# Patient Record
Sex: Female | Born: 1950 | Race: White | Hispanic: No | Marital: Married | Smoking: Never smoker
Health system: Southern US, Community
[De-identification: ages and names within clinical notes are randomized; demographics above are authoritative.]

## PROBLEM LIST (undated history)

## (undated) DIAGNOSIS — I10 Essential (primary) hypertension: Secondary | ICD-10-CM

## (undated) DIAGNOSIS — E7800 Pure hypercholesterolemia, unspecified: Secondary | ICD-10-CM

## (undated) DIAGNOSIS — M810 Age-related osteoporosis without current pathological fracture: Secondary | ICD-10-CM

## (undated) HISTORY — DX: Essential (primary) hypertension: I10

## (undated) HISTORY — DX: Pure hypercholesterolemia, unspecified: E78.00

## (undated) HISTORY — DX: Age-related osteoporosis without current pathological fracture: M81.0

## (undated) HISTORY — PX: CHOLECYSTECTOMY: SHX55

---

## 2018-03-14 DIAGNOSIS — I10 Essential (primary) hypertension: Secondary | ICD-10-CM | POA: Insufficient documentation

## 2018-03-14 DIAGNOSIS — R935 Abnormal findings on diagnostic imaging of other abdominal regions, including retroperitoneum: Secondary | ICD-10-CM | POA: Insufficient documentation

## 2018-03-21 DIAGNOSIS — E782 Mixed hyperlipidemia: Secondary | ICD-10-CM | POA: Insufficient documentation

## 2018-03-21 DIAGNOSIS — Z8673 Personal history of transient ischemic attack (TIA), and cerebral infarction without residual deficits: Secondary | ICD-10-CM | POA: Insufficient documentation

## 2018-04-19 DIAGNOSIS — I639 Cerebral infarction, unspecified: Secondary | ICD-10-CM | POA: Insufficient documentation

## 2018-04-19 DIAGNOSIS — E785 Hyperlipidemia, unspecified: Secondary | ICD-10-CM | POA: Insufficient documentation

## 2019-04-11 DIAGNOSIS — E119 Type 2 diabetes mellitus without complications: Secondary | ICD-10-CM | POA: Insufficient documentation

## 2019-05-29 DIAGNOSIS — R413 Other amnesia: Secondary | ICD-10-CM | POA: Insufficient documentation

## 2019-05-29 DIAGNOSIS — E119 Type 2 diabetes mellitus without complications: Secondary | ICD-10-CM | POA: Insufficient documentation

## 2019-07-06 DIAGNOSIS — M8580 Other specified disorders of bone density and structure, unspecified site: Secondary | ICD-10-CM | POA: Insufficient documentation

## 2019-07-06 DIAGNOSIS — K449 Diaphragmatic hernia without obstruction or gangrene: Secondary | ICD-10-CM | POA: Insufficient documentation

## 2019-07-06 DIAGNOSIS — K2970 Gastritis, unspecified, without bleeding: Secondary | ICD-10-CM | POA: Insufficient documentation

## 2019-07-06 DIAGNOSIS — F0150 Vascular dementia without behavioral disturbance: Secondary | ICD-10-CM | POA: Insufficient documentation

## 2019-07-06 DIAGNOSIS — K2980 Duodenitis without bleeding: Secondary | ICD-10-CM | POA: Insufficient documentation

## 2019-12-10 ENCOUNTER — Ambulatory Visit: Payer: Medicare PPO | Attending: Internal Medicine

## 2019-12-10 DIAGNOSIS — Z23 Encounter for immunization: Secondary | ICD-10-CM

## 2019-12-10 NOTE — Progress Notes (Signed)
   Covid-19 Vaccination Clinic  Name:  Danielle George    MRN: [id number] DOB: 23-Apr-1951  12/10/2019  [name] was observed post Covid-19 immunization for 15 minutes without incidence. She was provided with Vaccine Information Sheet and instruction to access the V-Safe system.   [name] was instructed to call 911 with any severe reactions post vaccine: Marland Kitchen Difficulty breathing  . Swelling of your face and throat  . A fast heartbeat  . A bad rash all over your body  . Dizziness and weakness    Immunizations Administered    Name Date Dose VIS Date Route   Pfizer COVID-19 Vaccine 12/10/2019  5:18 PM 0.3 mL 10/13/2019 Intramuscular   Manufacturer: [employer], [employer]   Lot: [id number]   NDC: [address]-2

## 2019-12-31 ENCOUNTER — Ambulatory Visit: Payer: Self-pay

## 2020-01-04 ENCOUNTER — Ambulatory Visit: Payer: Medicare PPO | Attending: Internal Medicine

## 2020-01-04 DIAGNOSIS — Z23 Encounter for immunization: Secondary | ICD-10-CM | POA: Insufficient documentation

## 2020-01-04 NOTE — Progress Notes (Signed)
   Covid-19 Vaccination Clinic  Name:  Danielle George    MRN: [id number] DOB: 1951/04/06  01/04/2020  [name] was observed post Covid-19 immunization for 15 minutes without incident. She was provided with Vaccine Information Sheet and instruction to access the V-Safe system.   [name] was instructed to call 911 with any severe reactions post vaccine: Marland Kitchen Difficulty breathing  . Swelling of face and throat  . A fast heartbeat  . A bad rash all over body  . Dizziness and weakness   Immunizations Administered    Name Date Dose VIS Date Route   Pfizer COVID-19 Vaccine 01/04/2020  2:11 PM 0.3 mL 10/13/2019 Intramuscular   Manufacturer: [employer], [employer]   Lot: [id number]   NDC: [address]-8

## 2022-01-01 ENCOUNTER — Encounter: Payer: Self-pay | Admitting: Family

## 2022-01-01 ENCOUNTER — Ambulatory Visit: Payer: Medicare PPO | Admitting: Family

## 2022-01-01 ENCOUNTER — Ambulatory Visit (INDEPENDENT_AMBULATORY_CARE_PROVIDER_SITE_OTHER)
Admission: RE | Admit: 2022-01-01 | Discharge: 2022-01-01 | Disposition: A | Discharge status: Home / Self Care | Payer: Medicare PPO | Source: Ambulatory Visit | Attending: Family | Admitting: Family

## 2022-01-01 ENCOUNTER — Other Ambulatory Visit: Payer: Self-pay

## 2022-01-01 VITALS — BP 124/82 | HR 109 | Ht 64.0 in | Wt 136.0 lb

## 2022-01-01 DIAGNOSIS — E782 Mixed hyperlipidemia: Secondary | ICD-10-CM | POA: Diagnosis not present

## 2022-01-01 DIAGNOSIS — I10 Essential (primary) hypertension: Secondary | ICD-10-CM | POA: Diagnosis not present

## 2022-01-01 DIAGNOSIS — M25551 Pain in right hip: Secondary | ICD-10-CM | POA: Diagnosis not present

## 2022-01-01 DIAGNOSIS — Z780 Asymptomatic menopausal state: Secondary | ICD-10-CM

## 2022-01-01 DIAGNOSIS — R739 Hyperglycemia, unspecified: Secondary | ICD-10-CM | POA: Insufficient documentation

## 2022-01-01 DIAGNOSIS — I69319 Unspecified symptoms and signs involving cognitive functions following cerebral infarction: Secondary | ICD-10-CM | POA: Diagnosis not present

## 2022-01-01 NOTE — Progress Notes (Signed)
? ?New Patient Office Visit ? ?Subjective:  ?Patient ID: Danielle George, female    DOB: 24-Oct-1951  Age: 70 y.o. MRN: [id number] ? ?CC:  ?Chief Complaint  ?Patient presents with  ? Abdominal Pain  ?  Pt stated had injury 7 years ago, right side hip is sore for about --1 month  ? ? ?HPI ?Danielle George is here to establish care as a new patient. ? ?Has provider in [place], [place].  ?Pt is without acute concerns.  ? ?Right hip pain, feels some soreness/puffiness on the right anterior hip. Sore with movement, did hike a lot in the last few weeks in [place], and raking leaves as well which probably hasn't helped.  ?Aspercreme with mild relief. Bone density in the past, no known ostoeporosis or osteopenia.  ? ?chronic concerns: ? ?GERD: with suspected ventral hernia, controlled with prilosec 40 mg  ? ?CVA: incidentally found on MRI? By opthalmologic. With memory deficits from this. Sees opthamologist, biannually who is working on prisms that are helpful to the eye to retrain for memory, referred from neurologist. Ongoing memory concerns.  ? ?HLD: last labwork about six months ago tolerating crestor well.  ? ?History reviewed. No pertinent past medical history. ? ?Past Surgical History:  ?Procedure Laterality Date  ? CHOLECYSTECTOMY    ? ? ?No family history on file. ? ?Social History  ? ?Socioeconomic History  ? Marital status: Married  ?  Spouse name: Not on file  ? Number of children: Not on file  ? Years of education: Not on file  ? Highest education level: Not on file  ?Occupational History  ? Not on file  ?Tobacco Use  ? Smoking status: Never  ? Smokeless tobacco: Never  ?Vaping Use  ? Vaping Use: Never used  ?Substance and Sexual Activity  ? Alcohol use: Not Currently  ? Drug use: Never  ? Sexual activity: Yes  ?  Partners: Male  ?Other Topics Concern  ? Not on file  ?Social History Narrative  ? Not on file  ? ?Social Determinants of Health  ? ?Financial Resource Strain: Not on file  ?Food  Insecurity: Not on file  ?Transportation Needs: Not on file  ?Physical Activity: Not on file  ?Stress: Not on file  ?Social Connections: Not on file  ?Intimate Partner Violence: Not on file  ? ? ?Outpatient Medications Prior to Visit  ?Medication Sig Dispense Refill  ? aspirin EC 81 MG tablet Take 81 mg by mouth daily. Swallow whole.    ? metoprolol succinate (TOPROL-XL) 25 MG 24 hr tablet Take 25 mg by mouth daily.    ? omeprazole (PRILOSEC) 40 MG capsule Take 40 mg by mouth daily.    ? rosuvastatin (CRESTOR) 10 MG tablet Take by mouth.    ? ?No facility-administered medications prior to visit.  ? ? ?Allergies  ?Allergen Reactions  ? Penicillins   ? ? ?ROS ?Review of Systems  ?Constitutional:  Negative for chills and fatigue.  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 Negative for photophobia.  ?Respiratory:  Negative for cough and shortness of breath.   ?Cardiovascular:  Negative for chest pain and leg swelling.  ?Gastrointestinal:  Negative for diarrhea and nausea.  ?Genitourinary:  Negative for difficulty urinating.  ?Musculoskeletal:  Positive for arthralgias (right hip /groin pain worse with movement).  ?Psychiatric/Behavioral:  Positive for confusion (at times with memory deficits since stroke). Negative for agitation, decreased concentration,  sleep disturbance and suicidal ideas. The patient is not nervous/anxious.   ?All other systems reviewed and are negative. ? ? ?  ?Objective:  ?  ?Physical Exam ?Cardiovascular:  ?   Rate and Rhythm: Normal rate.  ?Musculoskeletal:  ?   Right hip: Tenderness present. No deformity. Crepitus: point tenderness right lower groin/hip.Decreased range of motion. Normal strength.  ?   Left hip: Normal.  ?     Legs: ? ?   Comments: Painful ROM with extension external rotation ?Painful ROM with right and left rotation of waist ?  ? ? ? ?BP 124/82   Pulse (!) 109   Ht 5' 4"  (1.626 m)   Wt 136  lb (61.7 kg)   SpO2 98%   BMI 23.34 kg/m?  ?Wt Readings from Last 3 Encounters:  ?01/01/22 136 lb (61.7 kg)  ? ? ? ?Health Maintenance Due  ?Topic Date Due  ? Hepatitis C Screening  Never done  ? TETANUS/TDAP  Never done  ? COLONOSCOPY (Pts 45-46yr Insurance coverage will need to be confirmed)  Never done  ? MAMMOGRAM  Never done  ? Zoster Vaccines- Shingrix (1 of 2) Never done  ? Pneumonia Vaccine 69 Years old (1 - PCV) Never done  ? DEXA SCAN  Never done  ? COVID-19 Vaccine (3 - Booster for Pfizer series) 02/29/2020  ? INFLUENZA VACCINE  Never done  ? ? ?There are no preventive care reminders to display for this patient. ? ?No results found for: TSH ?No results found for: WBC, HGB, HCT, MCV, PLT ?No results found for: NA, K, CHLORIDE, CO2, GLUCOSE, BUN, CREATININE, BILITOT, ALKPHOS, AST, ALT, PROT, ALBUMIN, CALCIUM, ANIONGAP, EGFR, GFR ?No results found for: CHOL ?No results found for: HDL ?No results found for: LCresco?No results found for: TRIG ?No results found for: CHOLHDL ?No results found for: HGBA1C ? ?  ?Assessment & Plan:  ? ?Problem List Items Addressed This Visit   ? ?  ? Cardiovascular and Mediastinum  ? CVA, old, cognitive deficits - Primary  ?  Continue with daily aspirin continue to manage cholesterol limits continue with Crestor.  Lipid panel ordered today to review.  Patient declined neurology referral.  Patient [place]  ?  ? Relevant Medications  ? metoprolol succinate (TOPROL-XL) 25 MG 24 hr tablet  ? rosuvastatin (CRESTOR) 10 MG tablet  ? aspirin EC 81 MG tablet  ? Other Relevant Orders  ? B12 and Folate Panel  ? CBC  ? TSH  ? Primary hypertension  ?  Continue metoprolol monitor blood pressure periodically as needed goal less than 140/90 ?  ?  ? Relevant Medications  ? metoprolol succinate (TOPROL-XL) 25 MG 24 hr tablet  ? rosuvastatin (CRESTOR) 10 MG tablet  ? aspirin EC 81 MG tablet  ? Other Relevant Orders  ? Basic metabolic panel  ?  Lipid panel  ?  ? Other  ? Hyperglycemia  ?  Will order A1c today pending results work on diabetic diet and exercise as tolerated ?  ?  ? Mixed hyperlipidemia  ?  Lipid panel ordered pending results continue Crestor work on low-cholesterol diet and exercise as tolerated ?  ?  ? Relevant Medications  ? metoprolol succinate (TOPROL-XL) 25 MG 24 hr tablet  ? rosuvastatin (CRESTOR) 10 MG tablet  ? aspirin EC 81 MG tablet  ? Post-menopausal  ? Relevant Orders  ? DG Bone Density  ? DG HIP UNILAT WITH PELVIS 2-3 VIEWS  RIGHT  ? Pelvic joint pain, right  ?  Right hip and pelvis x-ray ordered pending results ?Also ordering bone density to rule out osteoporosis ?Heat and ice alternating as needed Tylenol as needed and rest for the next week or so ?Suspected pain due to overuse ?  ?  ? Relevant Orders  ? DG HIP UNILAT WITH PELVIS 2-3 VIEWS RIGHT  ? Right hip pain  ?  Hip and bone density x-ray pending results ?  ?  ? Relevant Orders  ? DG Bone Density  ? DG HIP UNILAT WITH PELVIS 2-3 VIEWS RIGHT  ? ? ?No orders of the defined types were placed in this encounter. ? ? ?Follow-up: Return in about 3 months (around 04/03/2022) for follow up .  ? ? ?Eugenia Pancoast, FNP ?

## 2022-01-01 NOTE — Assessment & Plan Note (Signed)
Lipid panel ordered pending results continue Crestor work on low-cholesterol diet and exercise as tolerated ?

## 2022-01-01 NOTE — Assessment & Plan Note (Signed)
Continue with daily aspirin continue to manage cholesterol limits continue with Crestor.  Lipid panel ordered today to review.  Patient declined neurology referral.  Patient to continue follow-up with ophthalmologist in [place] ?

## 2022-01-01 NOTE — Assessment & Plan Note (Signed)
Hip and bone density x-ray pending results ?

## 2022-01-01 NOTE — Assessment & Plan Note (Signed)
Right hip and pelvis x-ray ordered pending results ?Also ordering bone density to rule out osteoporosis ?Heat and ice alternating as needed Tylenol as needed and rest for the next week or so ?Suspected pain due to overuse ?

## 2022-01-01 NOTE — Assessment & Plan Note (Signed)
Will order A1c today pending results work on diabetic diet and exercise as tolerated ?

## 2022-01-01 NOTE — Patient Instructions (Addendum)
Stop by the lab prior to leaving today. I will notify you of your results once received.  ? ?Complete xray(s) prior to leaving today. I will notify you of your results once received. ? ?I have ordered imaging for you at [facility] outpatient diagnostic center for a bone density scan. This order has been sent over for you electronically.  ? ?Please call [phone] to schedule this appointment. ? ?It was a pleasure seeing you today! Please do not hesitate to reach out with any questions and or concerns. ? ?Regards,  ? ?Gaylan Fauver ?FNP-C ? ?

## 2022-01-01 NOTE — Assessment & Plan Note (Signed)
Continue metoprolol monitor blood pressure periodically as needed goal less than 140/90 ?

## 2022-01-09 ENCOUNTER — Other Ambulatory Visit (INDEPENDENT_AMBULATORY_CARE_PROVIDER_SITE_OTHER): Payer: Medicare PPO

## 2022-01-09 ENCOUNTER — Other Ambulatory Visit: Payer: Self-pay

## 2022-01-09 DIAGNOSIS — I69319 Unspecified symptoms and signs involving cognitive functions following cerebral infarction: Secondary | ICD-10-CM | POA: Diagnosis not present

## 2022-01-09 DIAGNOSIS — I10 Essential (primary) hypertension: Secondary | ICD-10-CM

## 2022-01-09 LAB — LIPID PANEL
Cholesterol: 165 mg/dL (ref 0–200)
HDL: 73.5 mg/dL (ref >39.00)
LDL Cholesterol: 77 mg/dL (ref 0–99)
NonHDL: 91.61
Total CHOL/HDL Ratio: 2
Triglycerides: 71 mg/dL (ref 0.0–149.0)
VLDL: 14.2 mg/dL (ref 0.0–40.0)

## 2022-01-09 LAB — CBC
HCT: 39.8 % (ref 36.0–46.0)
Hemoglobin: 13.4 g/dL (ref 12.0–15.0)
MCHC: 33.7 g/dL (ref 30.0–36.0)
MCV: 93.6 fl (ref 78.0–100.0)
Platelets: 170 10*3/uL (ref 150.0–400.0)
RBC: 4.26 Mil/uL (ref 3.87–5.11)
RDW: 13.1 % (ref 11.5–15.5)
WBC: 5.7 10*3/uL (ref 4.0–10.5)

## 2022-01-09 LAB — BASIC METABOLIC PANEL
BUN: 18 mg/dL (ref 6–23)
CO2: 26 mEq/L (ref 19–32)
Calcium: 9.6 mg/dL (ref 8.4–10.5)
Chloride: 104 mEq/L (ref 96–112)
Creatinine, Ser: 1 mg/dL (ref 0.40–1.20)
GFR: 57.94 mL/min — ABNORMAL LOW (ref >60.00)
Glucose, Bld: 140 mg/dL — ABNORMAL HIGH (ref 70–99)
Potassium: 4.9 mEq/L (ref 3.5–5.1)
Sodium: 140 mEq/L (ref 135–145)

## 2022-01-09 LAB — B12 AND FOLATE PANEL
Folate: 14.6 ng/mL (ref >5.9)
Vitamin B-12: 339 pg/mL (ref 211–911)

## 2022-01-09 LAB — TSH: TSH: 1.52 u[IU]/mL (ref 0.35–5.50)

## 2022-01-12 ENCOUNTER — Other Ambulatory Visit (HOSPITAL_COMMUNITY): Payer: Medicare PPO

## 2022-01-12 ENCOUNTER — Ambulatory Visit
Admission: RE | Admit: 2022-01-12 | Discharge: 2022-01-12 | Disposition: A | Discharge status: Home / Self Care | Payer: Medicare PPO | Source: Ambulatory Visit | Attending: Family | Admitting: Family

## 2022-01-12 ENCOUNTER — Other Ambulatory Visit: Payer: Self-pay

## 2022-01-12 DIAGNOSIS — M25551 Pain in right hip: Secondary | ICD-10-CM | POA: Diagnosis present

## 2022-01-12 DIAGNOSIS — Z780 Asymptomatic menopausal state: Secondary | ICD-10-CM | POA: Diagnosis present

## 2022-01-13 ENCOUNTER — Other Ambulatory Visit: Payer: Self-pay | Admitting: Family

## 2022-01-13 DIAGNOSIS — M816 Localized osteoporosis [Lequesne]: Secondary | ICD-10-CM

## 2022-01-13 NOTE — Progress Notes (Signed)
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 I do suggest you start taking a medication called fosamax for this to try to stop progression and repair as best we can. I also suggest you take over the counter daily vitamin d and calcium as well as low weight bearing exercises to help strengthen bones as well. Are you willing to start medication?

## 2022-01-14 ENCOUNTER — Telehealth: Payer: Self-pay | Admitting: Family

## 2022-01-14 ENCOUNTER — Encounter: Payer: Self-pay | Admitting: *Deleted

## 2022-01-14 NOTE — Telephone Encounter (Signed)
Patient is returning our call re: bone density results. Advise pt she would receive a call back ?

## 2022-01-14 NOTE — Telephone Encounter (Signed)
Already spoke to pt regarding bone density. ?

## 2022-01-19 ENCOUNTER — Telehealth: Payer: Self-pay | Admitting: Family

## 2022-01-19 NOTE — Telephone Encounter (Signed)
Pt called in wants to know status of RX Fosamax stated she was told it would called in to the pharmacy at office  visit . Please advise [phone]   ?

## 2022-01-20 ENCOUNTER — Other Ambulatory Visit: Payer: Self-pay | Admitting: Family

## 2022-01-20 ENCOUNTER — Encounter: Payer: Self-pay | Admitting: Family

## 2022-01-20 DIAGNOSIS — M816 Localized osteoporosis [Lequesne]: Secondary | ICD-10-CM

## 2022-01-20 MED ORDER — ALENDRONATE SODIUM 70 MG PO TABS: 70.0000 mg | ORAL_TABLET | ORAL | 11 refills | Status: DC

## 2022-01-20 NOTE — Telephone Encounter (Signed)
Refill sent and mychart message sent to advise ?

## 2022-01-30 ENCOUNTER — Ambulatory Visit: Payer: Self-pay

## 2022-01-30 ENCOUNTER — Telehealth: Payer: Self-pay

## 2022-01-30 NOTE — Telephone Encounter (Signed)
I spoke with pt; pt said she has a constant sharp pain in rt hip and groin with pain level of 7, no redness,pt has swelling at upper leg and hip that goes over to lower abd. No fever and no abd pain. No recent injury noted. Pt said her husband is not at home now and is not sure what time he will return but knows it will be late afternoon because he has taken grandson out to eat and then going to arcade.Pt does not want to go to ED but pt will go to [facility] in [place] with appt. I scheduled appt for pt at [facility] 01/30/22 at 6:30. UC & ED precautions given and pt and pts husband voiced understanding. Pt asked me to give info to her husband as well and he voiced understanding. Sending note to Hayden Pedro FNP. While finishing note pts husband called back and he is taking pt to UC this evening but he wants FU appt with Hayden Pedro FNP on Mon. Also. Scheduled appt on 02/02/22 at 12 noon with Hayden Pedro FNP ?

## 2022-01-30 NOTE — Telephone Encounter (Signed)
Patient started having increased pain and swelling in right hip about a week ago. It has increased over time. Has sharp stabbing pain that is in the groin area. Pain is rated 7/10 of constant pain. She has tried Aspercream Heat and ice alternating as needed Tylenol as needed and rest and has not had any relief. Wanted to know if there was other option to something she can take or if she needs to be seen. She has been doing a lot of bending in the last week taking care of a sick dog.  ?

## 2022-02-02 ENCOUNTER — Ambulatory Visit: Payer: Medicare PPO | Admitting: Family

## 2022-02-02 ENCOUNTER — Encounter: Payer: Self-pay | Admitting: Family

## 2022-02-02 ENCOUNTER — Other Ambulatory Visit: Payer: Self-pay | Admitting: Family

## 2022-02-02 VITALS — BP 118/72 | HR 88 | Temp 98.7°F | Resp 16 | Ht 64.0 in | Wt 136.0 lb

## 2022-02-02 DIAGNOSIS — N3001 Acute cystitis with hematuria: Secondary | ICD-10-CM | POA: Diagnosis not present

## 2022-02-02 DIAGNOSIS — R1031 Right lower quadrant pain: Secondary | ICD-10-CM | POA: Diagnosis not present

## 2022-02-02 DIAGNOSIS — G8929 Other chronic pain: Secondary | ICD-10-CM | POA: Diagnosis not present

## 2022-02-02 DIAGNOSIS — M25551 Pain in right hip: Secondary | ICD-10-CM

## 2022-02-02 LAB — POCT URINALYSIS DIPSTICK
Bilirubin, UA: NEGATIVE
Blood, UA: POSITIVE
Glucose, UA: NEGATIVE
Leukocytes, UA: NEGATIVE
Nitrite, UA: POSITIVE
Protein, UA: POSITIVE — AB
Spec Grav, UA: >=1.03 — AB (ref 1.010–1.025)
Urobilinogen, UA: NEGATIVE E.U./dL — AB
pH, UA: 5 (ref 5.0–8.0)

## 2022-02-02 MED ORDER — CIPROFLOXACIN HCL 500 MG PO TABS: 500.0000 mg | ORAL_TABLET | Freq: Two times a day (BID) | ORAL | 0 refills | Status: DC

## 2022-02-02 NOTE — Addendum Note (Signed)
Addended by: Mort Sawyers on: 02/02/2022 12:54 PM ? ? Modules accepted: Orders ? ?

## 2022-02-02 NOTE — Progress Notes (Addendum)
? ?Established Patient Office Visit ? ?Subjective:  ?Patient ID: Danielle George, female    DOB: 06/09/1951  Age: 71 y.o. MRN: [id number] ? ?CC:  ?Chief Complaint  ?Patient presents with  ? Hip Pain  ?  X 2 weeks  ? ? ?HPI ?Danielle George is here today with concerns.  ?Two weeks ago started with right hip and right groin area, puffy to the touch. Aggravated by too much movement. When she sleeps on that side it is very tender. The pain is constant. Was using Aspercreme and ibuprofen/aleve with no real relief. Even with pain has been out in the yard, bending over often and aggravating it further. ? ?Did have this issue in the past and was treated with physical therapy for a soft tissue concern per husband. Didn't really help, this has been intermittently chronic for several years, 8-10 years. Initial injury may have been 8-10 years ago when she jumped off of a nightstand.  ? 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  ? ?No past medical history on file. ? ?Past Surgical History:  ?Procedure Laterality Date  ? CHOLECYSTECTOMY    ? ? ?No family history on file. ? ?Social History  ? ?Socioeconomic History  ? Marital status: Married  ?  Spouse name: Not on file  ? Number of children: Not on file  ? Years of education: Not on file  ? Highest education level: Not on file  ?Occupational History  ? Not on file  ?Tobacco Use  ? Smoking status: Never  ? Smokeless tobacco: Never  ?Vaping Use  ? Vaping Use: Never used  ?Substance and Sexual Activity  ? Alcohol use: Not Currently  ? Drug use: Never  ? Sexual activity: Yes  ?  Partners: Male  ?Other Topics Concern  ? Not on file  ?Social History Narrative  ? Not on file  ? ?Social Determinants of Health  ? ?Financial Resource Strain: Not on file  ?Food Insecurity: Not on file  ?Transportation Needs: Not on file  ?Physical Activity: Not on file  ?Stress: Not on file  ?Social Connections: Not on file  ?Intimate Partner Violence: Not on file   ? ? ?Outpatient Medications Prior to Visit  ?Medication Sig Dispense Refill  ? alendronate (FOSAMAX) 70 MG tablet Take 1 tablet (70 mg total) by mouth every 7 (seven) days. Take with a full glass of water on an empty stomach. 4 tablet 11  ? aspirin EC 81 MG tablet Take 81 mg by mouth daily. Swallow whole.    ? metoprolol succinate (TOPROL-XL) 25 MG 24 hr tablet Take 25 mg by mouth daily.    ? omeprazole (PRILOSEC) 40 MG capsule Take 40 mg by mouth daily.    ? rosuvastatin (CRESTOR) 10 MG tablet Take by mouth.    ? ?No facility-administered medications prior to visit.  ? ? ?Allergies  ?Allergen Reactions  ? Penicillins   ? ? ?ROS ?Review of Systems  ?Constitutional:  Negative for chills and fever.  ?HENT:  Negative for congestion, ear pain, sinus pressure and sore throat.   ?Respiratory:  Negative for cough, shortness of breath and wheezing.   ?Cardiovascular:  Negative for chest pain and palpitations.  ?Gastrointestinal:  Positive for abdominal distention (bloating rlq abd) and abdominal pain (rlq). Negative for blood in stool, constipation, diarrhea, nausea and vomiting.  ?Genitourinary:  Positive for pelvic pain (right lower). Negative for difficulty urinating, frequency, urgency, vaginal bleeding and vaginal discharge.  ?Musculoskeletal:  Positive for arthralgias (right hip pain with radiation to right lower abd, constant). Negative for back pain.  ? ?  ?Objective:  ?  ?Physical Exam ?Vitals reviewed.  ?Constitutional:   ?   General: She is not in acute distress. ?   Appearance: Normal appearance. She is normal weight. She is not ill-appearing, toxic-appearing or diaphoretic.  ?Pulmonary:  ?   Effort: Pulmonary effort is normal.  ?Abdominal:  ?   General: Abdomen is flat. There is distension (rlq).  ?   Palpations: There is no mass.  ?   Tenderness: There is abdominal tenderness (rlq abd tenderness with rebound). There is rebound. There is no guarding.  ?   Hernia: No hernia is present.  ?Musculoskeletal:     ?    General: Tenderness (with hyperextension flexion , pain felt in rlq abd) present. No signs of injury.  ?   Right lower leg: No edema.  ?   Left lower leg: No edema.  ?   Comments: No bony tenderness right hip ?  ?Neurological:  ?   General: No focal deficit present.  ?   Mental Status: She is alert.  ?Psychiatric:     ?   Mood and Affect: Mood normal.     ?   Behavior: Behavior normal.     ?   Thought Content: Thought content normal.     ?   Judgment: Judgment normal.  ? ? ?BP 118/72   Pulse 88   Temp 98.7 ?F (37.1 ?C)   Resp 16   Ht 5\' 4"  (1.626 m)   Wt 136 lb (61.7 kg)   SpO2 91%   BMI 23.34 kg/m?  ?Wt Readings from Last 3 Encounters:  ?02/02/22 136 lb (61.7 kg)  ?01/01/22 136 lb (61.7 kg)  ? ? ? ?Health Maintenance Due  ?Topic Date Due  ? Hepatitis C Screening  Never done  ? TETANUS/TDAP  Never done  ? COLONOSCOPY (Pts 45-17yrs Insurance coverage will need to be confirmed)  Never done  ? MAMMOGRAM  Never done  ? Zoster Vaccines- Shingrix (1 of 2) Never done  ? Pneumonia Vaccine 70+ Years old (1 - PCV) Never done  ? COVID-19 Vaccine (3 - Booster for Pfizer series) 02/29/2020  ? ? ?There are no preventive care reminders to display for this patient. ? ?Lab Results  ?Component Value Date  ? TSH 1.52 01/09/2022  ? ?Lab Results  ?Component Value Date  ? WBC 5.7 01/09/2022  ? HGB 13.4 01/09/2022  ? HCT 39.8 01/09/2022  ? MCV 93.6 01/09/2022  ? PLT 170.0 01/09/2022  ? ?Lab Results  ?Component Value Date  ? NA 140 01/09/2022  ? K 4.9 01/09/2022  ? CO2 26 01/09/2022  ? GLUCOSE 140 (H) 01/09/2022  ? BUN 18 01/09/2022  ? CREATININE 1.00 01/09/2022  ? CALCIUM 9.6 01/09/2022  ? GFR 57.94 (L) 01/09/2022  ? ?No results found for: HGBA1C ? ?  ?Assessment & Plan:  ? ?Problem List Items Addressed This Visit   ? ?  ? Genitourinary  ? Acute cystitis with hematuria  ?  poct dipstick urine in office today with nitrates and blood.  ?Treating for uti.  ?rx cipro 500 mg  ?Increase water intake.  ?  ?  ? Relevant Medications  ?  ciprofloxacin (CIPRO) 500 MG tablet  ?  ? Other  ? Right lower quadrant abdominal pain - Primary  ?  Ct abd pelvis stat w contrast ordered, however poct dipstick positive for  UTI while in process for Ct auth.  ?Ct approved, but we will hold on this pending treatment of UTI. Urine culture sent as well. Pending results.  ?Cbc bmp reviewed from 3/2 ?If worsening abd pain go to er and or call 911 ?  ?  ? Relevant Orders  ? CT Abdomen Pelvis W Contrast  ? Urine Culture  ? POCT urinalysis dipstick  ? Chronic right hip pain  ?  Chronic, not likely due to this flare up likely pain from pelvic pain/uti.  ?  ?  ? ? ?Meds ordered this encounter  ?Medications  ? ciprofloxacin (CIPRO) 500 MG tablet  ?  Sig: Take 1 tablet (500 mg total) by mouth 2 (two) times daily for 5 days.  ?  Dispense:  10 tablet  ?  Refill:  0  ?  Order Specific Question:   Supervising Provider  ?  Answer:   BEDSOLE, AMY E [[id number]]  ? ? ?Follow-up: No follow-ups on file.  ? ? ?Danielle Sawyersabitha Bettyann Birchler, FNP ?

## 2022-02-02 NOTE — Assessment & Plan Note (Addendum)
Chronic, not likely due to this flare up likely pain from pelvic pain/uti.  ?

## 2022-02-02 NOTE — Patient Instructions (Signed)
It was a pleasure seeing you today.   You were found to have a urinary tract infection, you have been prescribed an antibiotic to your preferred pharmacy. Please start antibiotic today as directed.   We are sending your urine for a culture to make sure you do not have a resistant bacteria. We will call you if we need to change your medications.   Please make sure you are drinking plenty of fluids over the next few days.  If your symptoms do not improve over the next 5-7 days, or if they worsen, please let us know. Please also let us know if you have worsening back pain, fevers, chills, or body aches.   Regards,   Declan Adamson  

## 2022-02-02 NOTE — Assessment & Plan Note (Addendum)
Ct abd pelvis stat w contrast ordered, however poct dipstick positive for UTI while in process for Ct auth.  ?Ct approved, but we will hold on this pending treatment of UTI. Urine culture sent as well. Pending results.  ?Cbc bmp reviewed from 3/2 ?If worsening abd pain go to er and or call 911 ?

## 2022-02-02 NOTE — Assessment & Plan Note (Signed)
poct dipstick urine in office today with nitrates and blood.  ?Treating for uti.  ?rx cipro 500 mg  ?Increase water intake.  ?

## 2022-02-05 ENCOUNTER — Telehealth: Payer: Self-pay

## 2022-02-05 ENCOUNTER — Encounter: Payer: Self-pay | Admitting: Family

## 2022-02-05 DIAGNOSIS — N3001 Acute cystitis with hematuria: Secondary | ICD-10-CM

## 2022-02-05 LAB — URINE CULTURE
MICRO NUMBER:: 13214348
SPECIMEN QUALITY:: ADEQUATE

## 2022-02-05 MED ORDER — CIPROFLOXACIN HCL 500 MG PO TABS: 500.0000 mg | ORAL_TABLET | Freq: Two times a day (BID) | ORAL | 0 refills | Status: AC

## 2022-02-05 NOTE — Telephone Encounter (Signed)
Spoke to pt and let her know that the antibiotic was sent. She said her pain is getting worse, and Ibuprofen is not even helping. Also they would be willing to have the appendix looked at as well. ?

## 2022-02-05 NOTE — Telephone Encounter (Signed)
Addressed via mychart message

## 2022-02-05 NOTE — Telephone Encounter (Signed)
Left message to return call to our office.  

## 2022-02-06 ENCOUNTER — Other Ambulatory Visit: Payer: Self-pay

## 2022-02-06 ENCOUNTER — Emergency Department: Payer: Medicare PPO

## 2022-02-06 ENCOUNTER — Emergency Department
Admission: EM | Admit: 2022-02-06 | Discharge: 2022-02-06 | Disposition: A | Discharge status: Home / Self Care | Payer: Medicare PPO | Attending: Emergency Medicine | Admitting: Emergency Medicine

## 2022-02-06 DIAGNOSIS — Z9049 Acquired absence of other specified parts of digestive tract: Secondary | ICD-10-CM | POA: Insufficient documentation

## 2022-02-06 DIAGNOSIS — Z131 Encounter for screening for diabetes mellitus: Secondary | ICD-10-CM | POA: Diagnosis not present

## 2022-02-06 DIAGNOSIS — N3000 Acute cystitis without hematuria: Secondary | ICD-10-CM | POA: Insufficient documentation

## 2022-02-06 DIAGNOSIS — R1031 Right lower quadrant pain: Secondary | ICD-10-CM | POA: Diagnosis present

## 2022-02-06 LAB — CBC WITH DIFFERENTIAL/PLATELET
Abs Immature Granulocytes: 0.02 10*3/uL (ref 0.00–0.07)
Basophils Absolute: 0 10*3/uL (ref 0.0–0.1)
Basophils Relative: 1 %
Eosinophils Absolute: 0.1 10*3/uL (ref 0.0–0.5)
Eosinophils Relative: 1 %
HCT: 39 % (ref 36.0–46.0)
Hemoglobin: 13 g/dL (ref 12.0–15.0)
Immature Granulocytes: 0 %
Lymphocytes Relative: 26 %
Lymphs Abs: 1.8 10*3/uL (ref 0.7–4.0)
MCH: 31.3 pg (ref 26.0–34.0)
MCHC: 33.3 g/dL (ref 30.0–36.0)
MCV: 94 fL (ref 80.0–100.0)
Monocytes Absolute: 0.6 10*3/uL (ref 0.1–1.0)
Monocytes Relative: 8 %
Neutro Abs: 4.6 10*3/uL (ref 1.7–7.7)
Neutrophils Relative %: 64 %
Platelets: 179 10*3/uL (ref 150–400)
RBC: 4.15 MIL/uL (ref 3.87–5.11)
RDW: 12.2 % (ref 11.5–15.5)
WBC: 7.1 10*3/uL (ref 4.0–10.5)
nRBC: 0 % (ref 0.0–0.2)

## 2022-02-06 LAB — COMPREHENSIVE METABOLIC PANEL
ALT: 19 U/L (ref 0–44)
AST: 24 U/L (ref 15–41)
Albumin: 4 g/dL (ref 3.5–5.0)
Alkaline Phosphatase: 51 U/L (ref 38–126)
Anion gap: 8 (ref 5–15)
BUN: 21 mg/dL (ref 8–23)
CO2: 25 mmol/L (ref 22–32)
Calcium: 9.1 mg/dL (ref 8.9–10.3)
Chloride: 105 mmol/L (ref 98–111)
Creatinine, Ser: 1.13 mg/dL — ABNORMAL HIGH (ref 0.44–1.00)
GFR, Estimated: 53 mL/min — ABNORMAL LOW (ref >60)
Glucose, Bld: 131 mg/dL — ABNORMAL HIGH (ref 70–99)
Potassium: 3.9 mmol/L (ref 3.5–5.1)
Sodium: 138 mmol/L (ref 135–145)
Total Bilirubin: 0.9 mg/dL (ref 0.3–1.2)
Total Protein: 6.9 g/dL (ref 6.5–8.1)

## 2022-02-06 LAB — URINALYSIS, COMPLETE (UACMP) WITH MICROSCOPIC
Bilirubin Urine: NEGATIVE
Glucose, UA: NEGATIVE mg/dL
Hgb urine dipstick: NEGATIVE
Ketones, ur: NEGATIVE mg/dL
Leukocytes,Ua: NEGATIVE
Nitrite: NEGATIVE
Protein, ur: NEGATIVE mg/dL
Specific Gravity, Urine: 1.019 (ref 1.005–1.030)
pH: 5 (ref 5.0–8.0)

## 2022-02-06 LAB — CBG MONITORING, ED: Glucose-Capillary: 105 mg/dL — ABNORMAL HIGH (ref 70–99)

## 2022-02-06 MED ORDER — KETOROLAC TROMETHAMINE 30 MG/ML IJ SOLN
15.0000 mg | Freq: Once | INTRAMUSCULAR | Status: AC
  Administered 2022-02-06: 15 mg via INTRAVENOUS
  Filled 2022-02-06: qty 1

## 2022-02-06 MED ORDER — GENTAMICIN SULFATE 40 MG/ML IJ SOLN
2.5000 mg/kg | Freq: Once | INTRAMUSCULAR | Status: DC
  Filled 2022-02-06: qty 3.75

## 2022-02-06 MED ORDER — FOSFOMYCIN TROMETHAMINE 3 G PO PACK
3.0000 g | PACK | Freq: Once | ORAL | Status: AC
  Administered 2022-02-06: 3 g via ORAL
  Filled 2022-02-06: qty 3

## 2022-02-06 MED ORDER — PHENAZOPYRIDINE HCL 100 MG PO TABS: 100.0000 mg | ORAL_TABLET | Freq: Three times a day (TID) | ORAL | 0 refills | Status: DC | PRN

## 2022-02-06 MED ORDER — SODIUM CHLORIDE 0.9 % IV BOLUS
1000.0000 mL | Freq: Once | INTRAVENOUS | Status: AC
  Administered 2022-02-06: 1000 mL via INTRAVENOUS

## 2022-02-06 MED ORDER — MELOXICAM 7.5 MG PO TABS: 7.5000 mg | ORAL_TABLET | Freq: Every day | ORAL | 0 refills | Status: DC

## 2022-02-06 MED ORDER — IOHEXOL 300 MG/ML  SOLN
100.0000 mL | Freq: Once | INTRAMUSCULAR | Status: AC | PRN
  Administered 2022-02-06: 100 mL via INTRAVENOUS
  Filled 2022-02-06: qty 100

## 2022-02-06 NOTE — ED Provider Notes (Signed)
? ?[facility] ?Provider Note ? ?Patient Contact: 6:43 PM (approximate) ? ? ?History  ? ?Hip Pain ? ? ?HPI ? ?Danielle George is a 71 y.o. female who presents the emergency department complaining of right lower abdominal pain for 5 to 7 days.  Patient saw her primary care 4 days ago, was diagnosed with a UTI that was confirmed with urine culture growing E. coli.  Patient states that she has been taking her prescribed antibiotics, Cipro but continues to have pain.  He states that the pain initially was more lower in her abdomen, seems to be traveling higher in her abdomen into the RUQ/R flank region.  She has had no fevers, nausea vomiting, diarrhea or constipation.  She still has her appendix, does not have her gallbladder. ?  ? ? ?Physical Exam  ? ?Triage Vital Signs: ?ED Triage Vitals  ?Enc Vitals Group  ?   BP 02/06/22 1823 (!) 173/96  ?   Pulse Rate 02/06/22 1823 84  ?   Resp 02/06/22 1823 16  ?   Temp 02/06/22 1823 98.6 ?F (37 ?C)  ?   Temp Source 02/06/22 1823 Oral  ?   SpO2 02/06/22 1823 95 %  ?   Weight 02/06/22 1824 136 lb (61.7 kg)  ?   Height 02/06/22 1824 5\' 4"  (1.626 m)  ?   Head Circumference --   ?   Peak Flow --   ?   Pain Score --   ?   Pain Loc --   ?   Pain Edu? --   ?   Excl. in GC? --   ? ? ?Most recent vital signs: ?Vitals:  ? 02/06/22 1823 02/06/22 1958  ?BP: (!) 173/96 (!) 163/92  ?Pulse: 84 90  ?Resp: 16 14  ?Temp: 98.6 ?F (37 ?C)   ?SpO2: 95% 100%  ? ? ? ?General: Alert and in no acute distress.   ?Cardiovascular:  Good peripheral perfusion ?Respiratory: Normal respiratory effort without tachypnea or retractions. Lungs CTAB. Good air entry to the bases with no decreased or absent breath sounds. ?Gastrointestinal: Bowel sounds ?4 quadrants.  Soft to palpation all quadrants.  Patient is tender in the right upper upper quadrant.  No significant right lower quadrant tenderness..  She does guard in this right upper quadrant area.  No tenderness in the left quadrants with  no guarding of the left side.  No rigidity. No palpable masses. No distention. No CVA tenderness. ?Musculoskeletal: Full range of motion to all extremities.  ?Neurologic:  No gross focal neurologic deficits are appreciated.  ?Skin:   No rash noted ?Other: ? ? ?ED Results / Procedures / Treatments  ? ?Labs ?(all labs ordered are listed, but only abnormal results are displayed) ?Labs Reviewed  ?URINALYSIS, COMPLETE (UACMP) WITH MICROSCOPIC - Abnormal; Notable for the following components:  ?    Result Value  ? Color, Urine YELLOW (*)   ? APPearance CLEAR (*)   ? Bacteria, UA RARE (*)   ? All other components within normal limits  ?COMPREHENSIVE METABOLIC PANEL - Abnormal; Notable for the following components:  ? Glucose, Bld 131 (*)   ? Creatinine, Ser 1.13 (*)   ? GFR, Estimated 53 (*)   ? All other components within normal limits  ?CBC WITH DIFFERENTIAL/PLATELET  ?CBG MONITORING, ED  ? ? ? ?EKG ? ? ? ? ?RADIOLOGY ? ?I personally viewed and evaluated these images as part of my medical decision making, as well as reviewing the  written report by the radiologist. ? ?ED Provider Interpretation: No evidence of acute findings such as nephrolithiasis, pyelonephritis, appendicitis, colitis on CT ? ?CT ABDOMEN PELVIS W CONTRAST ? ?Result Date: 02/06/2022 ?CLINICAL DATA:  Right lower quadrant pain for 1 week EXAM: CT ABDOMEN AND PELVIS WITH CONTRAST TECHNIQUE: Multidetector CT imaging of the abdomen and pelvis was performed using the standard protocol following bolus administration of intravenous contrast. RADIATION DOSE REDUCTION: This exam was performed according to the departmental dose-optimization program which includes automated exposure control, adjustment of the mA and/or kV according to patient size and/or use of iterative reconstruction technique. CONTRAST:  OMNIPAQUE IOHEXOL 300 MG/ML  SOLN COMPARISON:  None. FINDINGS: Lower chest: Lung bases are free of acute infiltrate or sizable effusion. There is a 5 mm  pleural based density in the left lower lobe best seen on image number 8 of series 4. Hepatobiliary: Gallbladder has been surgically removed. Liver is within normal limits. Pancreas: Unremarkable. No pancreatic ductal dilatation or surrounding inflammatory changes. Spleen: Normal in size without focal abnormality. Adrenals/Urinary Tract: Adrenal glands are within normal limits. Kidneys demonstrate a normal enhancement pattern. No obstructive changes are noted. Bladder is well distended. Stomach/Bowel: No obstructive or inflammatory changes of the colon are seen. The appendix is not well visualized although no Peri appendiceal inflammatory changes are seen. Stomach and small bowel are within normal limits. Vascular/Lymphatic: No significant vascular findings are present. No enlarged abdominal or pelvic lymph nodes. Reproductive: Uterus and bilateral adnexa are unremarkable. Other: No abdominal wall hernia or abnormality. No abdominopelvic ascites. Musculoskeletal: No acute or significant osseous findings. IMPRESSION: No acute abnormality is noted to correspond with the given clinical history. 5 mm left lower lobe solid pulmonary nodule. No routine follow-up imaging is recommended per Fleischner Society Guidelines. These guidelines do not apply to immunocompromised patients and patients with cancer. Follow up in patients with significant comorbidities as clinically warranted. For lung cancer screening, adhere to Lung-RADS guidelines. Reference: Radiology. 2017; 284(1):228-43. Electronically Signed   By: Alcide Clever M.D.   On: 02/06/2022 20:29   ? ?PROCEDURES: ? ?Critical Care performed: No ? ?Procedures ? ? ?MEDICATIONS ORDERED IN ED: ?Medications  ?fosfomycin (MONUROL) packet 3 g (has no administration in time range)  ?ketorolac (TORADOL) 30 MG/ML injection 15 mg (has no administration in time range)  ?sodium chloride 0.9 % bolus 1,000 mL (0 mLs Intravenous Stopped 02/06/22 2014)  ?iohexol (OMNIPAQUE) 300 MG/ML  solution 100 mL (100 mLs Intravenous Contrast Given 02/06/22 2002)  ? ? ? ?IMPRESSION / MDM / ASSESSMENT AND PLAN / ED COURSE  ?I reviewed the triage vital signs and the nursing notes. ?             ?               ? ?Differential diagnosis includes, but is not limited to, pyelonephritis, UTI, appendicitis, colitis ? ? ?Patient's diagnosis is consistent with ongoing UTI.  Patient presented to the emergency department complaining of ascending pain with a known UTI.  Patient was E. coli positive on urine culture that was obtained 5 days ago.  She was started on Cipro which shows sensitivity.  Patient is on day 4 medications.  She has had pain in her right lower quadrant which started with her original UTI symptoms.  She states that the pain has been traveling superior to the original site.  Patient is now having pain more in the right upper quadrant and right flank region.  Patient no longer has  her gallbladder but still has her appendix.  No fevers.  No emesis.  Pain has been present for the last 7 to 8 days.  With work-up today, urinalysis is improving when compared to previous urinalysis 5 days ago at her primary care.  Patient had CT scan to evaluate for nephrolithiasis or evidence of pyelonephritis.  CT is reassuring at this time with no concerning findings to include appendicitis, pyelonephritis or nephrolithiasis.  Patient is given fosfomycin here in the ED as she is allergic to penicillin.  Continue her Cipro.  We will add medications to improve inflammation in the urinary tract.  Given the location of pain, and the length I have a low suspicion for ovarian torsion as pain is not severe, has been 7 to 8 days, in the right upper quadrant at this time..  Follow-up with primary care as needed.  Return precautions discussed with the patient and her husband.  Patient is given ED precautions to return to the ED for any worsening or new symptoms. ? ? ? ?  ? ? ?FINAL CLINICAL IMPRESSION(S) / ED DIAGNOSES  ? ?Final  diagnoses:  ?Acute cystitis without hematuria  ? ? ? ?Rx / DC Orders  ? ?ED Discharge Orders   ? ?      Ordered  ?  phenazopyridine (PYRIDIUM) 100 MG tablet  3 times daily PRN       ? 02/06/22 2111  ?  meloxicam (MO

## 2022-02-06 NOTE — ED Triage Notes (Signed)
Pt to ED via POV from home. Pt c/o right hip pain x1 week. Pt currently being treated with UTI and still taking antibiotics. Pt denies N/V/D or fevers. Pt denies any recent falls or traumas.   ?

## 2022-02-10 ENCOUNTER — Telehealth: Payer: Self-pay

## 2022-02-10 NOTE — Telephone Encounter (Signed)
Patient states she is having issues with bowel movements. She is having issues with memory and could not clarify information so husband got on phone and requested appointment for evaluation. No action needed on this call.  ?

## 2022-02-11 NOTE — Telephone Encounter (Signed)
Pt has appt scheduled for 4/13 for ER follow up with [facility]. ?

## 2022-02-12 ENCOUNTER — Ambulatory Visit: Payer: Medicare PPO | Admitting: Family

## 2022-02-12 VITALS — BP 148/70 | HR 98 | Temp 99.0°F | Resp 16 | Ht 64.0 in | Wt 134.3 lb

## 2022-02-12 DIAGNOSIS — K602 Anal fissure, unspecified: Secondary | ICD-10-CM | POA: Diagnosis not present

## 2022-02-12 DIAGNOSIS — M25551 Pain in right hip: Secondary | ICD-10-CM

## 2022-02-12 DIAGNOSIS — R195 Other fecal abnormalities: Secondary | ICD-10-CM | POA: Insufficient documentation

## 2022-02-12 DIAGNOSIS — N3001 Acute cystitis with hematuria: Secondary | ICD-10-CM

## 2022-02-12 DIAGNOSIS — K649 Unspecified hemorrhoids: Secondary | ICD-10-CM | POA: Insufficient documentation

## 2022-02-12 DIAGNOSIS — R1031 Right lower quadrant pain: Secondary | ICD-10-CM | POA: Diagnosis not present

## 2022-02-12 DIAGNOSIS — Z87440 Personal history of urinary (tract) infections: Secondary | ICD-10-CM | POA: Insufficient documentation

## 2022-02-12 DIAGNOSIS — M1611 Unilateral primary osteoarthritis, right hip: Secondary | ICD-10-CM

## 2022-02-12 MED ORDER — HYDROCORTISONE (PERIANAL) 2.5 % EX CREA: 1.0000 "application " | TOPICAL_CREAM | Freq: Two times a day (BID) | CUTANEOUS | 0 refills | Status: AC

## 2022-02-12 NOTE — Assessment & Plan Note (Signed)
Chronic, however still with pain.  ?Positive ifobt, referring to GI.  ?

## 2022-02-12 NOTE — Assessment & Plan Note (Signed)
Repeat urine culture to witness improvement ?

## 2022-02-12 NOTE — Assessment & Plan Note (Signed)
Reviewed CT abd pelvis from 4/7 r/o appendicitis, kidney concerns, or abdominal blockages.  ?Positive iFOBT in office, needs stat referral GI however [place], and [place]CP there, advised pt needs prompt referral. They will call their PCP and inquire about this.  ? ?

## 2022-02-12 NOTE — Assessment & Plan Note (Signed)
Excoriated ?Wipe gently with unscented wipe and or wet wash cloth, avoid toilet paper as more aggravating ?Recommend otc desitin for improvement and barrier ? ?

## 2022-02-12 NOTE — Assessment & Plan Note (Signed)
rx anusol cream 2.5% ?

## 2022-02-12 NOTE — Progress Notes (Signed)
? ?Established Patient Office Visit ? ?Subjective:  ?Patient ID: Danielle George, female    DOB: 26-Feb-1951  Age: 71 y.o. MRN: [id number] ? ?CC:  ?Chief Complaint  ?Patient presents with  ? Follow-up  ? Hemorrhoids  ? Hip Pain  ?  X several weeks.  ? ? ?HPI ?Corleen Otwell is here today with concerns.  ? ?Was seen in ER 4/7 with CT for right lower ongoing abdominal pain. Had been on day four of ciprofloxacin due to positive UTI on urine. Confirmed with urine culture growing E-coli. ? CT showed no acute findings.  ? Incidental 5 mm pulmonary nodule left lower lobe. ?Otherwise no acute findings. ?Appendix did not show inflammation.   ? ?Still with right lower pelvic to lower abdominal pain especially with touching. Not aggravated by movement. No improvement with meloxicam or treatment with UTI ? ?Reoccurring rectal itching, about once monthly with increased bowel movements and wiping often. Usually uses preparation H and this time using it caused her some burning. The rectum is not itching currently but two days was. Scant blood when wiping because felt raw because she was itching when she had a bowel movement which caused blood she thinks. No recent blood in the last few days. No constipation, no diarrhea. No straining with bowel movement.  ? ?No past medical history on file. ? ?Past Surgical History:  ?Procedure Laterality Date  ? CHOLECYSTECTOMY    ? ? ?No family history on file. ? ?Social History  ? ?Socioeconomic History  ? Marital status: Married  ?  Spouse name: Not on file  ? Number of children: Not on file  ? Years of education: Not on file  ? Highest education level: Not on file  ?Occupational History  ? Not on file  ?Tobacco Use  ? Smoking status: Never  ? Smokeless tobacco: Never  ?Vaping Use  ? Vaping Use: Never used  ?Substance and Sexual Activity  ? Alcohol use: Not Currently  ? Drug use: Never  ? Sexual activity: Yes  ?  Partners: Male  ?Other Topics Concern  ? Not on file  ?Social History  Narrative  ? Not on file  ? ?Social Determinants of Health  ? ?Financial Resource Strain: Not on file  ?Food Insecurity: Not on file  ?Transportation Needs: Not on file  ?Physical Activity: Not on file  ?Stress: Not on file  ?Social Connections: Not on file  ?Intimate Partner Violence: Not on file  ? ? ?Outpatient Medications Prior to Visit  ?Medication Sig Dispense Refill  ? alendronate (FOSAMAX) 70 MG tablet Take 1 tablet (70 mg total) by mouth every 7 (seven) days. Take with a full glass of water on an empty stomach. 4 tablet 11  ? aspirin EC 81 MG tablet Take 81 mg by mouth daily. Swallow whole.    ? meloxicam (MOBIC) 7.5 MG tablet Take 1 tablet (7.5 mg total) by mouth daily. 14 tablet 0  ? metoprolol succinate (TOPROL-XL) 25 MG 24 hr tablet Take 25 mg by mouth daily.    ? omeprazole (PRILOSEC) 40 MG capsule Take 40 mg by mouth daily.    ? phenazopyridine (PYRIDIUM) 100 MG tablet Take 1 tablet (100 mg total) by mouth 3 (three) times daily as needed for pain. 6 tablet 0  ? rosuvastatin (CRESTOR) 10 MG tablet Take by mouth.    ? ?No facility-administered medications prior to visit.  ? ? ?Allergies  ?Allergen Reactions  ? Penicillins   ? ? ?ROS ?Review of  Systems  ?Constitutional:  Negative for chills, fatigue, fever and unexpected weight change.  ?Eyes:  Negative for visual disturbance.  ?Respiratory:  Negative for shortness of breath.   ?Cardiovascular:  Negative for chest pain.  ?Gastrointestinal:  Positive for abdominal pain (right lower abd/pelvic tenderness) and rectal pain (irritated area from increased wiping with toilet paper). Negative for blood in stool (not visualized per pt), constipation, diarrhea, nausea and vomiting.  ?     Rectal itching ?  ?Genitourinary:  Positive for pelvic pain (right). Negative for decreased urine volume, difficulty urinating, dysuria, flank pain, frequency and urgency.  ?Skin:  Negative for rash.  ?Neurological:  Negative for dizziness and headaches.  ? ?  ?Objective:  ?   ?Physical Exam ?Vitals reviewed. Exam conducted with a chaperone present.  ?Constitutional:   ?   General: She is not in acute distress. ?   Appearance: Normal appearance. She is normal weight. She is not ill-appearing, toxic-appearing or diaphoretic.  ?Pulmonary:  ?   Effort: Pulmonary effort is normal.  ?Abdominal:  ?   General: Abdomen is flat. Bowel sounds are normal. There is no distension.  ?   Palpations: Abdomen is soft.  ?   Tenderness: There is abdominal tenderness.  ?Genitourinary: ?   Rectum: Guaiac result positive (positive). Anal fissure (excoriation surrounding rectum with redness and irritation) and external hemorrhoid (small non thrombosed pink) present. No mass or tenderness. Normal anal tone.  ?Musculoskeletal:     ?   General: Tenderness (right hip) present.  ?Skin: ?   General: Skin is warm.  ?Neurological:  ?   General: No focal deficit present.  ?   Mental Status: She is alert and oriented to person, place, and time.  ?Psychiatric:     ?   Mood and Affect: Mood normal.     ?   Behavior: Behavior normal.     ?   Thought Content: Thought content normal.     ?   Judgment: Judgment normal.  ? ? ?BP (!) 148/70   Pulse 98   Temp 99 ?F (37.2 ?C)   Resp 16   Ht 5\' 4"  (1.626 m)   Wt 134 lb 5 oz (60.9 kg)   SpO2 98%   BMI 23.05 kg/m?  ?Wt Readings from Last 3 Encounters:  ?02/12/22 134 lb 5 oz (60.9 kg)  ?02/06/22 136 lb (61.7 kg)  ?02/02/22 136 lb (61.7 kg)  ? ? ? ?Health Maintenance Due  ?Topic Date Due  ? Hepatitis C Screening  Never done  ? TETANUS/TDAP  Never done  ? COLONOSCOPY (Pts 45-57yrs Insurance coverage will need to be confirmed)  Never done  ? MAMMOGRAM  Never done  ? Zoster Vaccines- Shingrix (1 of 2) Never done  ? Pneumonia Vaccine 40+ Years old (1 - PCV) Never done  ? COVID-19 Vaccine (3 - Booster for Pfizer series) 02/29/2020  ? ? ?There are no preventive care reminders to display for this patient. ? ?Lab Results  ?Component Value Date  ? TSH 1.52 01/09/2022  ? ?Lab Results   ?Component Value Date  ? WBC 7.1 02/06/2022  ? HGB 13.0 02/06/2022  ? HCT 39.0 02/06/2022  ? MCV 94.0 02/06/2022  ? PLT 179 02/06/2022  ? ?Lab Results  ?Component Value Date  ? NA 138 02/06/2022  ? K 3.9 02/06/2022  ? CO2 25 02/06/2022  ? GLUCOSE 131 (H) 02/06/2022  ? BUN 21 02/06/2022  ? CREATININE 1.13 (H) 02/06/2022  ? BILITOT 0.9 02/06/2022  ?  ALKPHOS 51 02/06/2022  ? AST 24 02/06/2022  ? ALT 19 02/06/2022  ? PROT 6.9 02/06/2022  ? ALBUMIN 4.0 02/06/2022  ? CALCIUM 9.1 02/06/2022  ? ANIONGAP 8 02/06/2022  ? GFR 57.94 (L) 01/09/2022  ? ?No results found for: HGBA1C ? ?  ?Assessment & Plan:  ? ?Problem List Items Addressed This Visit   ? ?  ? Cardiovascular and Mediastinum  ? Acute hemorrhoid - Primary  ?  rx anusol cream 2.5% ?  ?  ? Relevant Medications  ? hydrocortisone (ANUSOL-HC) 2.5 % rectal cream  ? Other Relevant Orders  ? IFOBT POC (occult bld, rslt in office)  ?  ? Digestive  ? Anal fissure  ?  Excoriated ?Wipe gently with unscented wipe and or wet wash cloth, avoid toilet paper as more aggravating ?Recommend otc desitin for improvement and barrier ? ?  ?  ?  ? Musculoskeletal and Integument  ? Osteoarthritis of right hip  ?  Continue meloxicam 7.5 to see if any improvement in pain  ?  ?  ?  ? Genitourinary  ? History of urinary tract infection  ?  Repeat urine culture to see if improvement/resolution ?  ?  ? Relevant Orders  ? Urine Culture  ? RESOLVED: Acute cystitis with hematuria  ?  Repeat urine culture to witness improvement ?  ?  ?  ? Other  ? Pelvic joint pain, right  ?  Chronic, however still with pain.  ?Positive ifobt, referring to GI.  ?  ?  ? Right lower quadrant abdominal pain  ?  Reviewed CT abd pelvis from 4/7 r/o appendicitis, kidney concerns, or abdominal blockages.  ?Positive iFOBT in office, needs stat referral GI however [place] going [place], and [place] PCP there, advised pt needs prompt referral. They will call their PCP and inquire about this.  ? ?  ?  ? Relevant Orders  ?  IFOBT POC (occult bld, rslt in office)  ? Positive fecal occult blood test  ?  Advised for stat referral for GI , possible colonoscopy in home town. ?They will call their hometown pcp for this.  ?  ?  ? ? ?Meds ordered t

## 2022-02-12 NOTE — Assessment & Plan Note (Signed)
Advised for stat referral for GI , possible colonoscopy in home town. ?They will call their hometown pcp for this.  ?

## 2022-02-12 NOTE — Assessment & Plan Note (Signed)
Continue meloxicam 7.5 to see if any improvement in pain  ?

## 2022-02-12 NOTE — Patient Instructions (Signed)
Recommend applying desitin to site of irritation at rectum, this is over the counter.  ? ?Please call primary care in your other destination to let them know positive fecal occult blood test in office with ongoing abdominal right lower pain. Recommend stat referral to GI/ for possible need for colonoscopy if they can get thisi set up. Let me know if they need anything further.  ? ?Due to recent changes in healthcare laws, you may see results of your imaging and/or laboratory studies on MyChart before I have had a chance to review them.  I understand that in some cases there may be results that are confusing or concerning to you. Please understand that not all results are received at the same time and often I may need to interpret multiple results in order to provide you with the best plan of care or course of treatment. Therefore, I ask that you please give me 2 business days to thoroughly review all your results before contacting my office for clarification. Should we see a critical lab result, you will be contacted sooner.  ? ?It was a pleasure seeing you today! Please do not hesitate to reach out with any questions and or concerns. ? ?Regards,  ? ?Bertil Brickey ?FNP-C ? ? ?

## 2022-02-12 NOTE — Addendum Note (Signed)
Addended by: Shon Millet on: 02/12/2022 04:45 PM ? ? Modules accepted: Orders ? ?

## 2022-02-12 NOTE — Assessment & Plan Note (Signed)
Repeat urine culture to see if improvement/resolution ?

## 2022-02-13 ENCOUNTER — Other Ambulatory Visit (INDEPENDENT_AMBULATORY_CARE_PROVIDER_SITE_OTHER): Payer: Medicare PPO

## 2022-02-13 DIAGNOSIS — Z87440 Personal history of urinary (tract) infections: Secondary | ICD-10-CM | POA: Diagnosis not present

## 2022-02-13 LAB — POCT URINALYSIS DIP (CLINITEK)
Bilirubin, UA: NEGATIVE
Blood, UA: NEGATIVE
Glucose, UA: NEGATIVE mg/dL
Ketones, POC UA: NEGATIVE mg/dL
Leukocytes, UA: NEGATIVE
Nitrite, UA: POSITIVE — AB
POC PROTEIN,UA: NEGATIVE
Spec Grav, UA: >=1.03 — AB (ref 1.010–1.025)
Urobilinogen, UA: 1 E.U./dL
pH, UA: 5 (ref 5.0–8.0)

## 2022-02-14 LAB — URINE CULTURE
MICRO NUMBER:: 13265573
SPECIMEN QUALITY:: ADEQUATE

## 2022-03-05 ENCOUNTER — Encounter: Payer: Self-pay | Admitting: *Deleted

## 2022-03-09 NOTE — Telephone Encounter (Signed)
[name], do we need to cancel the current order? ?

## 2022-03-11 NOTE — Telephone Encounter (Signed)
Noted. Order cancelled

## 2022-08-14 DIAGNOSIS — R41842 Visuospatial deficit: Secondary | ICD-10-CM | POA: Diagnosis not present

## 2022-08-24 ENCOUNTER — Other Ambulatory Visit: Payer: Medicare PPO

## 2022-08-31 ENCOUNTER — Ambulatory Visit (INDEPENDENT_AMBULATORY_CARE_PROVIDER_SITE_OTHER): Payer: Medicare PPO | Admitting: Family

## 2022-08-31 ENCOUNTER — Encounter: Payer: Self-pay | Admitting: Family

## 2022-08-31 VITALS — BP 112/70 | HR 94 | Temp 97.9°F | Resp 16 | Ht 64.0 in | Wt 131.1 lb

## 2022-08-31 DIAGNOSIS — Z23 Encounter for immunization: Secondary | ICD-10-CM | POA: Diagnosis not present

## 2022-08-31 DIAGNOSIS — M1611 Unilateral primary osteoarthritis, right hip: Secondary | ICD-10-CM | POA: Diagnosis not present

## 2022-08-31 DIAGNOSIS — R1031 Right lower quadrant pain: Secondary | ICD-10-CM

## 2022-08-31 DIAGNOSIS — K644 Residual hemorrhoidal skin tags: Secondary | ICD-10-CM

## 2022-08-31 DIAGNOSIS — R739 Hyperglycemia, unspecified: Secondary | ICD-10-CM | POA: Diagnosis not present

## 2022-08-31 DIAGNOSIS — Z0001 Encounter for general adult medical examination with abnormal findings: Secondary | ICD-10-CM | POA: Insufficient documentation

## 2022-08-31 DIAGNOSIS — E538 Deficiency of other specified B group vitamins: Secondary | ICD-10-CM | POA: Diagnosis not present

## 2022-08-31 LAB — BASIC METABOLIC PANEL
BUN: 21 mg/dL (ref 6–23)
CO2: 24 mEq/L (ref 19–32)
Calcium: 9.4 mg/dL (ref 8.4–10.5)
Chloride: 104 mEq/L (ref 96–112)
Creatinine, Ser: 0.99 mg/dL (ref 0.40–1.20)
GFR: 58.38 mL/min — ABNORMAL LOW (ref >60.00)
Glucose, Bld: 192 mg/dL — ABNORMAL HIGH (ref 70–99)
Potassium: 4.2 mEq/L (ref 3.5–5.1)
Sodium: 138 mEq/L (ref 135–145)

## 2022-08-31 LAB — CBC
HCT: 39.2 % (ref 36.0–46.0)
Hemoglobin: 13.2 g/dL (ref 12.0–15.0)
MCHC: 33.6 g/dL (ref 30.0–36.0)
MCV: 93.5 fl (ref 78.0–100.0)
Platelets: 158 10*3/uL (ref 150.0–400.0)
RBC: 4.19 Mil/uL (ref 3.87–5.11)
RDW: 13 % (ref 11.5–15.5)
WBC: 7.6 10*3/uL (ref 4.0–10.5)

## 2022-08-31 LAB — B12 AND FOLATE PANEL
Folate: 13.5 ng/mL (ref >5.9)
Vitamin B-12: 1117 pg/mL — ABNORMAL HIGH (ref 211–911)

## 2022-08-31 LAB — HEMOGLOBIN A1C: Hgb A1c MFr Bld: 6.8 % — ABNORMAL HIGH (ref 4.6–6.5)

## 2022-08-31 MED ORDER — MELOXICAM 7.5 MG PO TABS: 7.5000 mg | ORAL_TABLET | Freq: Every day | ORAL | 0 refills | Status: DC

## 2022-08-31 MED ORDER — HYDROCORTISONE (PERIANAL) 2.5 % EX CREA: 1.0000 | TOPICAL_CREAM | Freq: Two times a day (BID) | CUTANEOUS | 0 refills | Status: DC

## 2022-08-31 NOTE — Progress Notes (Signed)
Established Patient Office Visit  Subjective:  Patient ID: Danielle George, female    DOB: Jan 16, 1951  Age: 71 y.o. MRN: [id number]  CC:  Chief Complaint  Patient presents with   Annual Exam    HPI Scottlynn Lindell is here for an annual physical as well as with some acute concerns.   Mammogram: 03/19/22, negative.  Dexa: 01/12/22: osteoporosis, taking fosamax.  Pneumonia vaccine : pretty certain she had it, will need to get vaccination records.  Tetanus: states likely got in [place], will obtain records.   Still with right sided hip pain, does have OA in her right hip. Went back to [place], and had a colonoscopy which she states came back negative. Will have to obtain records  Lately with increasing hemorrhoids that are itchy and uncomfortable. They are not painful but do feel swollen. Uses otc prep H that helps some. No blood on stool per her recollections.   Dental visits: goes every six months.  Yearly eye exams, up to date.   No past medical history on file.  Past Surgical History:  Procedure Laterality Date   CHOLECYSTECTOMY      No family history on file.  Social History   Socioeconomic History   Marital status: Married    Spouse name: Not on file   Number of children: Not on file   Years of education: Not on file   Highest education level: Not on file  Occupational History   Not on file  Tobacco Use   Smoking status: Never   Smokeless tobacco: Never  Vaping Use   Vaping Use: Never used  Substance and Sexual Activity   Alcohol use: Not Currently   Drug use: Never   Sexual activity: Yes    Partners: Male  Other Topics Concern   Not on file  Social History Narrative   Not on file   Social Determinants of Health   Financial Resource Strain: Not on file  Food Insecurity: Not on file  Transportation Needs: Not on file  Physical Activity: Not on file  Stress: Not on file  Social Connections: Not on file  Intimate Partner Violence: Not on file     Outpatient Medications Prior to Visit  Medication Sig Dispense Refill   alendronate (FOSAMAX) 70 MG tablet Take 1 tablet (70 mg total) by mouth every 7 (seven) days. Take with a full glass of water on an empty stomach. 4 tablet 11   aspirin EC 81 MG tablet Take 81 mg by mouth daily. Swallow whole.     metoprolol succinate (TOPROL-XL) 25 MG 24 hr tablet Take 25 mg by mouth daily.     omeprazole (PRILOSEC) 40 MG capsule Take 40 mg by mouth daily.     phenazopyridine (PYRIDIUM) 100 MG tablet Take 1 tablet (100 mg total) by mouth 3 (three) times daily as needed for pain. 6 tablet 0   rosuvastatin (CRESTOR) 10 MG tablet Take by mouth.     meloxicam (MOBIC) 7.5 MG tablet Take 1 tablet (7.5 mg total) by mouth daily. (Patient not taking: Reported on 08/31/2022) 14 tablet 0   No facility-administered medications prior to visit.    Allergies  Allergen Reactions   Penicillins     ROS Review of Systems  Review of Systems  Respiratory:  Negative for shortness of breath.   Cardiovascular:  Negative for chest pain and palpitations.  Gastrointestinal:  Negative for constipation and diarrhea.  Genitourinary:  Negative for dysuria, frequency and urgency. Itchy hemorrhoids, not  swollen. No bleeding. Musculoskeletal:  Negative for myalgias. Positive for right hip pain, chronic Psychiatric/Behavioral:  Negative for depression and suicidal ideas.  Positive for short term memory loss All other systems reviewed and are negative.    Objective:    Physical Exam Vitals reviewed.  Constitutional:      General: She is not in acute distress.    Appearance: Normal appearance. She is not ill-appearing or toxic-appearing.  HENT:     Right Ear: Tympanic membrane normal.     Left Ear: Tympanic membrane normal.     Mouth/Throat:     Mouth: Mucous membranes are moist.     Pharynx: No pharyngeal swelling.     Tonsils: No tonsillar exudate.  Eyes:     Extraocular Movements: Extraocular movements intact.      Conjunctiva/sclera: Conjunctivae normal.     Pupils: Pupils are equal, round, and reactive to light.  Neck:     Thyroid: No thyroid mass.  Cardiovascular:     Rate and Rhythm: Normal rate and regular rhythm.  Pulmonary:     Effort: Pulmonary effort is normal.     Breath sounds: Normal breath sounds.  Abdominal:     General: Abdomen is flat. Bowel sounds are normal.     Palpations: Abdomen is soft.     Tenderness: There is abdominal tenderness (mild rlq abdominal suprapubic tenderness).  Genitourinary:    Rectum: External hemorrhoid (non thrombosed) present. No tenderness.  Musculoskeletal:        General: Normal range of motion.  Lymphadenopathy:     Cervical:     Right cervical: No superficial cervical adenopathy.    Left cervical: No superficial cervical adenopathy.  Skin:    General: Skin is warm.     Capillary Refill: Capillary refill takes less than 2 seconds.  Neurological:     General: No focal deficit present.     Mental Status: She is alert and oriented to person, place, and time.  Psychiatric:        Mood and Affect: Mood normal.        Behavior: Behavior normal.        Thought Content: Thought content normal.        Judgment: Judgment normal.       BP 112/70   Pulse 94   Temp 97.9 F (36.6 C)   Resp 16   Ht 5\' 4"  (1.626 m)   Wt 131 lb 2 oz (59.5 kg)   SpO2 98%   BMI 22.51 kg/m  Wt Readings from Last 3 Encounters:  08/31/22 131 lb 2 oz (59.5 kg)  02/12/22 134 lb 5 oz (60.9 kg)  02/06/22 136 lb (61.7 kg)     Health Maintenance Due  Topic Date Due   Hepatitis C Screening  Never done   TETANUS/TDAP  Never done   Pneumonia Vaccine 67+ Years old (1 - PCV) Never done   Medicare Annual Wellness (AWV)  08/08/2022    There are no preventive care reminders to display for this patient.  Lab Results  Component Value Date   TSH 1.52 01/09/2022   Lab Results  Component Value Date   WBC 7.1 02/06/2022   HGB 13.0 02/06/2022   HCT 39.0 02/06/2022    MCV 94.0 02/06/2022   PLT 179 02/06/2022   Lab Results  Component Value Date   NA 138 02/06/2022   K 3.9 02/06/2022   CO2 25 02/06/2022   GLUCOSE 131 (H) 02/06/2022   BUN 21 02/06/2022  CREATININE 1.13 (H) 02/06/2022   BILITOT 0.9 02/06/2022   ALKPHOS 51 02/06/2022   AST 24 02/06/2022   ALT 19 02/06/2022   PROT 6.9 02/06/2022   ALBUMIN 4.0 02/06/2022   CALCIUM 9.1 02/06/2022   ANIONGAP 8 02/06/2022   GFR 57.94 (L) 01/09/2022   Lab Results  Component Value Date   CHOL 165 01/09/2022   Lab Results  Component Value Date   HDL 73.50 01/09/2022   Lab Results  Component Value Date   LDLCALC 77 01/09/2022   Lab Results  Component Value Date   TRIG 71.0 01/09/2022   Lab Results  Component Value Date   CHOLHDL 2 01/09/2022   No results found for: "HGBA1C"    Assessment & Plan:   Problem List Items Addressed This Visit       Cardiovascular and Mediastinum   Hemorrhoids, external    rx anusol hc cream      Relevant Medications   hydrocortisone (ANUSOL-HC) 2.5 % rectal cream     Musculoskeletal and Integument   Osteoarthritis of right hip    Recommend otc voltaren gel Did recommend ref to orthopedist as chronic pain      Relevant Medications   meloxicam (MOBIC) 7.5 MG tablet   Other Relevant Orders   Ambulatory referral to Orthopedic Surgery     Other   Hyperglycemia   Relevant Orders   Hemoglobin A1c   Basic metabolic panel   Right lower quadrant abdominal pain    Chronic Ct abd unremarkable colonoscopy unremarkable.       Vitamin B12 deficiency    Continue otc b12 1000 mcg       Relevant Orders   B12 and Folate Panel   CBC   Encounter for general adult medical examination with abnormal findings - Primary    Patient Counseling(The following topics were reviewed):  Preventative care handout given to pt  Health maintenance and immunizations reviewed. Please refer to Health maintenance section. Pt advised on safe sex, wearing seatbelts  in car, and proper nutrition labwork ordered today for annual Dental health: Discussed importance of regular tooth brushing, flossing, and dental visits.  Received flu vaccine today in office.      RESOLVED: Need for immunization against influenza   Relevant Orders   Flu Vaccine QUAD High Dose(Fluad) (Completed)    Meds ordered this encounter  Medications   meloxicam (MOBIC) 7.5 MG tablet    Sig: Take 1 tablet (7.5 mg total) by mouth daily.    Dispense:  90 tablet    Refill:  0    Order Specific Question:   Supervising Provider    Answer:   BEDSOLE, AMY E [[id number]]   hydrocortisone (ANUSOL-HC) 2.5 % rectal cream    Sig: Place 1 Application rectally 2 (two) times daily.    Dispense:  30 g    Refill:  0    Order Specific Question:   Supervising Provider    Answer:   Ermalene Searing, AMY E [[id number]]    Follow-up: Return in about 6 months (around 03/02/2023) for f/u cholesterol.    Mort Sawyers, FNP

## 2022-08-31 NOTE — Assessment & Plan Note (Signed)
rx anusol hc cream

## 2022-08-31 NOTE — Assessment & Plan Note (Signed)
Chronic Ct abd unremarkable colonoscopy unremarkable.

## 2022-08-31 NOTE — Assessment & Plan Note (Signed)
Continue otc b12 1000 mcg

## 2022-08-31 NOTE — Assessment & Plan Note (Signed)
Patient Counseling(The following topics were reviewed):  Preventative care handout given to pt  Health maintenance and immunizations reviewed. Please refer to Health maintenance section. Pt advised on safe sex, wearing seatbelts in car, and proper nutrition labwork ordered today for annual Dental health: Discussed importance of regular tooth brushing, flossing, and dental visits.  Received flu vaccine today in office.

## 2022-08-31 NOTE — Patient Instructions (Addendum)
  I do recommend restarting meloxicam to 7.5 mg once daily this will help with right hip , the arthritis. She has osteoarthritis in her right hip. Try this daily and see if pain improves. Recommend you can put heating pad on the area if really painful and or use over the counter voltaren gel.   A referral was placed today for orthopedist for your ongoing hip pain.  Please let us know if you have not heard back within 2 weeks about the referral.  Stop by the lab prior to leaving today. I will notify you of your results once received.   Recommendations on keeping yourself healthy:  - Exercise at least 30-45 minutes a day, 3-4 days a week.  - Eat a low-fat diet with lots of fruits and vegetables, up to 7-9 servings per day.  - Seatbelts can save your life. Wear them always.  - Smoke detectors on every level of your home, check batteries every year.  - Eye Doctor - have an eye exam every 1-2 years  - Safe sex - if you may be exposed to STDs, use a condom.  - Alcohol -  If you drink, do it moderately, less than 2 drinks per day.  - [place]. Choose someone to speak for you if you are not able.  - Depression is common in our stressful world.If you're feeling down or losing interest in things you normally enjoy, please come in for a visit.  - Violence - If anyone is threatening or hurting you, please call immediately.  Due to recent changes in healthcare laws, you may see results of your imaging and/or laboratory studies on MyChart before I have had a chance to review them.  I understand that in some cases there may be results that are confusing or concerning to you. Please understand that not all results are received at the same time and often I may need to interpret multiple results in order to provide you with the best plan of care or course of treatment. Therefore, I ask that you please give me 2 business days to thoroughly review all your results before contacting my office for  clarification. Should we see a critical lab result, you will be contacted sooner.   I will see you again in one year for your annual comprehensive exam unless otherwise stated and or with acute concerns.  It was a pleasure seeing you today! Please do not hesitate to reach out with any questions and or concerns.  Regards,   Eugenia Pancoast

## 2022-08-31 NOTE — Assessment & Plan Note (Signed)
Recommend otc voltaren gel Did recommend ref to orthopedist as chronic pain

## 2022-09-01 NOTE — Progress Notes (Signed)
New dx diabetes have pt come in to discuss.

## 2022-09-02 ENCOUNTER — Ambulatory Visit: Payer: Medicare PPO | Admitting: Family

## 2022-09-02 ENCOUNTER — Encounter: Payer: Self-pay | Admitting: Family

## 2022-09-02 ENCOUNTER — Telehealth: Payer: Self-pay

## 2022-09-02 VITALS — BP 132/76 | HR 74 | Temp 98.2°F | Resp 16 | Ht 64.0 in | Wt 134.1 lb

## 2022-09-02 DIAGNOSIS — E119 Type 2 diabetes mellitus without complications: Secondary | ICD-10-CM | POA: Diagnosis not present

## 2022-09-02 MED ORDER — METFORMIN HCL 500 MG PO TABS: 500.0000 mg | ORAL_TABLET | Freq: Every day | ORAL | 1 refills | Status: DC

## 2022-09-02 MED ORDER — METFORMIN HCL 500 MG PO TABS: 500.0000 mg | ORAL_TABLET | Freq: Two times a day (BID) | ORAL | 3 refills | Status: DC

## 2022-09-02 NOTE — Telephone Encounter (Signed)
Called and spoke pt.

## 2022-09-02 NOTE — Patient Instructions (Addendum)
  Please return fasting at your lab appointment (meaning you can only drink black coffee and or water prior to your appointment). I will reach out to you in regards to the labs when I receive the results.   Start metformin 500 mg once daily.   Regards,   Eugenia Pancoast FNP-C

## 2022-09-02 NOTE — Telephone Encounter (Signed)
[facility] Night - Client Nonclinical Telephone Record  [facility] Client [name] Night - Client Client Site [place] - Night Provider AA - PHYSICIAN, Verita Schneiders- MD Contact Type Call Who Is Calling Patient / Member / Family / Caregiver Caller Name Pike Phone Number [phone] Call Type Message Only Information Provided Reason for Call Returning a Call from the Office Initial Comment Caller just missed call from office # and [name], not listed, asked them to callback. Pt of NP [name], not listed. Additional Comment re pt Danielle George (Oct 01, 1951) They are standing by for a callback Disp. Time Disposition Final User 09/01/2022 5:01:24 PM General Information Provided Yes Gokounous, Erin Call Closed By: Candy Sledge Transaction Date/Time: 09/01/2022 4:58:40 PM (ET

## 2022-09-02 NOTE — Assessment & Plan Note (Addendum)
Ordered hga1c today pending results. Work on diabetic diet and exercise as tolerated. Yearly foot exam, and annual eye exam.   Did recommend that pt start daily metformin 500 mg

## 2022-09-02 NOTE — Progress Notes (Signed)
Established Patient Office Visit  Subjective:  Patient ID: Danielle George, female    DOB: 1951/05/08  Age: 71 y.o. MRN: [id number]  CC:  Chief Complaint  Patient presents with   Diabetes    Discuss labs    HPI Danielle George is here today for follow up.   New diagnosis of diabetes: Was prediabetic with numbers in the lower 6's however never diabetic.  She does admit to eating hot fudge sundaes, not daily but a few times a week. She does like her sweets. She has been to a class for diabetic education in the past, husband is with her and does admit she eats a carb heavy diet.    B12 elevated: stopped taking b12 otc, will resume.     No past medical history on file.  Past Surgical History:  Procedure Laterality Date   CHOLECYSTECTOMY      No family history on file.  Social History   Socioeconomic History   Marital status: Married    Spouse name: Not on file   Number of children: Not on file   Years of education: Not on file   Highest education level: Not on file  Occupational History   Not on file  Tobacco Use   Smoking status: Never   Smokeless tobacco: Never  Vaping Use   Vaping Use: Never used  Substance and Sexual Activity   Alcohol use: Not Currently   Drug use: Never   Sexual activity: Yes    Partners: Male  Other Topics Concern   Not on file  Social History Narrative   Not on file   Social Determinants of Health   Financial Resource Strain: Not on file  Food Insecurity: Not on file  Transportation Needs: Not on file  Physical Activity: Not on file  Stress: Not on file  Social Connections: Not on file  Intimate Partner Violence: Not on file    Outpatient Medications Prior to Visit  Medication Sig Dispense Refill   alendronate (FOSAMAX) 70 MG tablet Take 1 tablet (70 mg total) by mouth every 7 (seven) days. Take with a full glass of water on an empty stomach. 4 tablet 11   aspirin EC 81 MG tablet Take 81 mg by mouth daily. Swallow  whole.     hydrocortisone (ANUSOL-HC) 2.5 % rectal cream Place 1 Application rectally 2 (two) times daily. 30 g 0   meloxicam (MOBIC) 7.5 MG tablet Take 1 tablet (7.5 mg total) by mouth daily. 90 tablet 0   metoprolol succinate (TOPROL-XL) 25 MG 24 hr tablet Take 25 mg by mouth daily.     omeprazole (PRILOSEC) 40 MG capsule Take 40 mg by mouth daily.     rosuvastatin (CRESTOR) 10 MG tablet Take by mouth.     phenazopyridine (PYRIDIUM) 100 MG tablet Take 1 tablet (100 mg total) by mouth 3 (three) times daily as needed for pain. 6 tablet 0   No facility-administered medications prior to visit.    Allergies  Allergen Reactions   Penicillins           Objective:    Physical Exam Constitutional:      General: She is not in acute distress.    Appearance: Normal appearance. She is normal weight. She is not ill-appearing, toxic-appearing or diaphoretic.  Cardiovascular:     Rate and Rhythm: Normal rate and regular rhythm.  Pulmonary:     Effort: Pulmonary effort is normal.  Neurological:     General: No focal  deficit present.     Mental Status: She is alert and oriented to person, place, and time. Mental status is at baseline.  Psychiatric:        Mood and Affect: Mood normal.        Behavior: Behavior normal.        Thought Content: Thought content normal.        Judgment: Judgment normal.       BP 132/76   Pulse 74   Temp 98.2 F (36.8 C)   Resp 16   Ht 5\' 4"  (1.626 m)   Wt 134 lb 2 oz (60.8 kg)   SpO2 99%   BMI 23.02 kg/m  Wt Readings from Last 3 Encounters:  09/02/22 134 lb 2 oz (60.8 kg)  08/31/22 131 lb 2 oz (59.5 kg)  02/12/22 134 lb 5 oz (60.9 kg)     Health Maintenance Due  Topic Date Due   FOOT EXAM  Never done   OPHTHALMOLOGY EXAM  Never done   Diabetic kidney evaluation - Urine ACR  Never done   Hepatitis C Screening  Never done   TETANUS/TDAP  Never done   Pneumonia Vaccine 29+ Years old (1 - PCV) Never done   Medicare Annual Wellness (AWV)   08/08/2022    There are no preventive care reminders to display for this patient.  Lab Results  Component Value Date   TSH 1.52 01/09/2022   Lab Results  Component Value Date   WBC 7.6 08/31/2022   HGB 13.2 08/31/2022   HCT 39.2 08/31/2022   MCV 93.5 08/31/2022   PLT 158.0 08/31/2022   Lab Results  Component Value Date   NA 138 08/31/2022   K 4.2 08/31/2022   CO2 24 08/31/2022   GLUCOSE 192 (H) 08/31/2022   BUN 21 08/31/2022   CREATININE 0.99 08/31/2022   BILITOT 0.9 02/06/2022   ALKPHOS 51 02/06/2022   AST 24 02/06/2022   ALT 19 02/06/2022   PROT 6.9 02/06/2022   ALBUMIN 4.0 02/06/2022   CALCIUM 9.4 08/31/2022   ANIONGAP 8 02/06/2022   GFR 58.38 (L) 08/31/2022   Lab Results  Component Value Date   CHOL 165 01/09/2022   Lab Results  Component Value Date   HDL 73.50 01/09/2022   Lab Results  Component Value Date   LDLCALC 77 01/09/2022   Lab Results  Component Value Date   TRIG 71.0 01/09/2022   Lab Results  Component Value Date   CHOLHDL 2 01/09/2022   Lab Results  Component Value Date   HGBA1C 6.8 (H) 08/31/2022      Assessment & Plan:   Problem List Items Addressed This Visit       Endocrine   Type 2 diabetes mellitus without complication, without long-term current use of insulin ([place]) - Primary    Ordered hga1c today pending results. Work on diabetic diet and exercise as tolerated. Yearly foot exam, and annual eye exam.   Did recommend that pt start daily metformin 500 mg        Relevant Medications   metFORMIN (GLUCOPHAGE) 500 MG tablet    Meds ordered this encounter  Medications   DISCONTD: metFORMIN (GLUCOPHAGE) 500 MG tablet    Sig: Take 1 tablet (500 mg total) by mouth 2 (two) times daily with a meal.    Dispense:  180 tablet    Refill:  3    Order Specific Question:   Supervising Provider    Answer:   Diona Browner, AMY E [  [id number]]   metFORMIN (GLUCOPHAGE) 500 MG tablet    Sig: Take 1 tablet (500 mg total) by mouth daily.     Dispense:  90 tablet    Refill:  1    Order Specific Question:   Supervising Provider    Answer:   Ermalene Searing, AMY E [[id number]]    Follow-up: Return in about 3 months (around 12/03/2022) for f/u diabetes, come fasting to appointment .    Mort Sawyers, FNP

## 2022-09-08 ENCOUNTER — Encounter: Payer: Self-pay | Admitting: Orthopaedic Surgery

## 2022-09-08 ENCOUNTER — Ambulatory Visit (INDEPENDENT_AMBULATORY_CARE_PROVIDER_SITE_OTHER): Payer: Medicare PPO | Admitting: Orthopaedic Surgery

## 2022-09-08 DIAGNOSIS — M25551 Pain in right hip: Secondary | ICD-10-CM

## 2022-09-08 MED ORDER — PREDNISONE 10 MG (21) PO TBPK: ORAL_TABLET | ORAL | 3 refills | Status: DC

## 2022-09-08 NOTE — Progress Notes (Signed)
Office Visit Note   Patient: Danielle George           Date of Birth: 09-25-1951           MRN: [id number] Visit Date: 09/08/2022              Requested by: Eugenia Pancoast, [place],  [place] [id number] PCP: Eugenia Pancoast, FNP   Assessment & Plan: Visit Diagnoses:  1. Pain in right hip     Plan: Impression is right hip pain.  I suspect she has hip flexor tendinitis.  Based on her treatment options she would like to try a prednisone Dosepak.  She elected to hold off on physical therapy for now.  She will follow-up with [place] if symptoms persist.  Follow-Up Instructions: No follow-ups on file.   Orders:  No orders of the defined types were placed in this encounter.  Meds ordered this encounter  Medications   predniSONE (STERAPRED UNI-PAK 21 TAB) 10 MG (21) TBPK tablet    Sig: Take as directed    Dispense:  21 tablet    Refill:  3      Procedures: No procedures performed   Clinical Data: No additional findings.   Subjective: Chief Complaint  Patient presents with   Right Hip - Pain    HPI Danielle George is a 71 year old female who comes in for evaluation of right hip pain for about 2 weeks without any known injuries.  Denies any groin pain.  She feels pain just distal to the ASIS.  She was prescribed meloxicam by PCP which has not helped.  Denies any radicular symptoms.  Review of Systems  Constitutional: Negative.   HENT: Negative.    Eyes: Negative.   Respiratory: Negative.    Cardiovascular: Negative.   Endocrine: Negative.   Musculoskeletal: Negative.   Neurological: Negative.   Hematological: Negative.   Psychiatric/Behavioral: Negative.    All other systems reviewed and are negative.    Objective: Vital Signs: There were no vitals taken for this visit.  Physical Exam Vitals and nursing note reviewed.  Constitutional:      Appearance: She is well-developed.  HENT:     Head: Atraumatic.     Nose: Nose normal.  Eyes:      Extraocular Movements: Extraocular movements intact.  Cardiovascular:     Pulses: Normal pulses.  Pulmonary:     Effort: Pulmonary effort is normal.  Abdominal:     Palpations: Abdomen is soft.  Musculoskeletal:     Cervical back: Neck supple.  Skin:    General: Skin is warm.     Capillary Refill: Capillary refill takes less than 2 seconds.  Neurological:     Mental Status: She is alert. Mental status is at baseline.  Psychiatric:        Behavior: Behavior normal.        Thought Content: Thought content normal.        Judgment: Judgment normal.     Ortho Exam Examination of the right hip shows painless fluid range of motion.  She has no trochanteric tenderness.  No sciatic tension signs.  Slight discomfort with resisted straight leg raise.  Negative FABER.  Negative FADIR. Specialty Comments:  No specialty comments available.  Imaging: No results found.   PMFS History: Patient Active Problem List   Diagnosis Date Noted   Type 2 diabetes mellitus without complication, without long-term current use of insulin ([place]) 09/02/2022   Hemorrhoids, external 08/31/2022  Vitamin B12 deficiency 08/31/2022   Encounter for general adult medical examination with abnormal findings 08/31/2022   Osteoarthritis of right hip 02/12/2022   Right lower quadrant abdominal pain 02/02/2022   Localized osteoporosis without current pathological fracture 01/13/2022   CVA, old, cognitive deficits 01/01/2022   Hyperglycemia 01/01/2022   Mixed hyperlipidemia 01/01/2022   Primary hypertension 01/01/2022   Post-menopausal 01/01/2022   Pelvic joint pain, right 01/01/2022   History reviewed. No pertinent past medical history.  No family history on file.  Past Surgical History:  Procedure Laterality Date   CHOLECYSTECTOMY     Social History   Occupational History   Not on file  Tobacco Use   Smoking status: Never   Smokeless tobacco: Never  Vaping Use   Vaping Use: Never used  Substance and  Sexual Activity   Alcohol use: Not Currently   Drug use: Never   Sexual activity: Yes    Partners: Male

## 2022-09-09 ENCOUNTER — Other Ambulatory Visit: Payer: Self-pay | Admitting: Physician Assistant

## 2022-09-09 NOTE — Telephone Encounter (Signed)
Take the steroid pack as directed. Do not take meloxicam or any other nsaids while on prednisone

## 2022-09-14 ENCOUNTER — Telehealth: Payer: Self-pay | Admitting: Family

## 2022-09-14 NOTE — Telephone Encounter (Signed)
LVM for pt ot rtn my call to schedule AWV-I with [facility] call back # [phone]

## 2022-10-20 ENCOUNTER — Ambulatory Visit (INDEPENDENT_AMBULATORY_CARE_PROVIDER_SITE_OTHER): Payer: Medicare PPO

## 2022-10-20 VITALS — Ht 64.0 in | Wt 134.0 lb

## 2022-10-20 DIAGNOSIS — Z0000 Encounter for general adult medical examination without abnormal findings: Secondary | ICD-10-CM | POA: Diagnosis not present

## 2022-10-20 NOTE — Patient Instructions (Addendum)
Ms. Pore , Thank you for taking time to come for your Medicare Wellness Visit. I appreciate your ongoing commitment to your health goals. Please review the following plan we discussed and let me know if I can assist you in the future.   These are the goals we discussed:  Goals      Maintain healthy lifestyle     Stay active Healthy lifestyle        This is a list of the screening recommended for you and due dates:  Health Maintenance  Topic Date Due   Complete foot exam   Never done   Yearly kidney health urinalysis for diabetes  Never done   COVID-19 Vaccine (3 - Pfizer risk series) 11/05/2022*   Pneumonia Vaccine (1 - PCV) 10/21/2023*   Hepatitis C Screening: USPSTF Recommendation to screen - Ages 18-79 yo.  10/21/2023*   Hemoglobin A1C  03/02/2023   Eye exam for diabetics  08/03/2023   Yearly kidney function blood test for diabetes  09/01/2023   Medicare Annual Wellness Visit  10/21/2023   Mammogram  03/19/2024   Colon Cancer Screening  04/07/2032   Flu Shot  Completed   DEXA scan (bone density measurement)  Completed   Zoster (Shingles) Vaccine  Completed   HPV Vaccine  Aged Out   DTaP/Tdap/Td vaccine  Discontinued  *Topic was postponed. The date shown is not the original due date.    Advanced directives: End of life planning; Advanced aging; Advanced directives discussed.  No HCPOA/Living Will.  Additional information available in office. Declined at this time.  Conditions/risks identified: none new  Next appointment: Follow up in one year for your annual wellness visit    Preventive Care 65 Years and Older, Female Preventive care refers to lifestyle choices and visits with your health care provider that can promote health and wellness. What does preventive care include? A yearly physical exam. This is also called an annual well check. Dental exams once or twice a year. Routine eye exams. Ask your health care provider how often you should have your eyes  checked. Personal lifestyle choices, including: Daily care of your teeth and gums. Regular physical activity. Eating a healthy diet. Avoiding tobacco and drug use. Limiting alcohol use. Practicing safe sex. Taking low-dose aspirin every day. Taking vitamin and mineral supplements as recommended by your health care provider. What happens during an annual well check? The services and screenings done by your health care provider during your annual well check will depend on your age, overall health, lifestyle risk factors, and family history of disease. Counseling  Your health care provider may ask you questions about your: Alcohol use. Tobacco use. Drug use. Emotional well-being. Home and relationship well-being. Sexual activity. Eating habits. History of falls. Memory and ability to understand (cognition). Work and work Astronomer. Reproductive health. Screening  You may have the following tests or measurements: Height, weight, and BMI. Blood pressure. Lipid and cholesterol levels. These may be checked every 5 years, or more frequently if you are over 75 years old. Skin check. Lung cancer screening. You may have this screening every year starting at age 50 if you have a 30-pack-year history of smoking and currently smoke or have quit within the past 15 years. Fecal occult blood test (FOBT) of the stool. You may have this test every year starting at age 49. Flexible sigmoidoscopy or colonoscopy. You may have a sigmoidoscopy every 5 years or a colonoscopy every 10 years starting at age 47. Hepatitis C blood  test. Hepatitis B blood test. Sexually transmitted disease (STD) testing. Diabetes screening. This is done by checking your blood sugar (glucose) after you have not eaten for a while (fasting). You may have this done every 1-3 years. Bone density scan. This is done to screen for osteoporosis. You may have this done starting at age 61. Mammogram. This may be done every 1-2  years. Talk to your health care provider about how often you should have regular mammograms. Talk with your health care provider about your test results, treatment options, and if necessary, the need for more tests. Vaccines  Your health care provider may recommend certain vaccines, such as: Influenza vaccine. This is recommended every year. Tetanus, diphtheria, and acellular pertussis (Tdap, Td) vaccine. You may need a Td booster every 10 years. Zoster vaccine. You may need this after age 33. Pneumococcal 13-valent conjugate (PCV13) vaccine. One dose is recommended after age 56. Pneumococcal polysaccharide (PPSV23) vaccine. One dose is recommended after age 54. Talk to your health care provider about which screenings and vaccines you need and how often you need them. This information is not intended to replace advice given to you by your health care provider. Make sure you discuss any questions you have with your health care provider. Document Released: 11/15/2015 Document Revised: 07/08/2016 Document Reviewed: 08/20/2015 Elsevier Interactive Patient Education  2017 [place] Prevention in the Home Falls can cause injuries. They can happen to people of all ages. There are many things you can do to make your home safe and to help prevent falls. What can I do on the outside of my home? Regularly fix the edges of walkways and driveways and fix any cracks. Remove anything that might make you trip as you walk through a door, such as a raised step or threshold. Trim any bushes or trees on the path to your home. Use bright outdoor lighting. Clear any walking paths of anything that might make someone trip, such as rocks or tools. Regularly check to see if handrails are loose or broken. Make sure that both sides of any steps have handrails. Any raised decks and porches should have guardrails on the edges. Have any leaves, snow, or ice cleared regularly. Use sand or salt on walking paths  during winter. Clean up any spills in your garage right away. This includes oil or grease spills. What can I do in the bathroom? Use night lights. Install grab bars by the toilet and in the tub and shower. Do not use towel bars as grab bars. Use non-skid mats or decals in the tub or shower. If you need to sit down in the shower, use a plastic, non-slip stool. Keep the floor dry. Clean up any water that spills on the floor as soon as it happens. Remove soap buildup in the tub or shower regularly. Attach bath mats securely with double-sided non-slip rug tape. Do not have throw rugs and other things on the floor that can make you trip. What can I do in the bedroom? Use night lights. Make sure that you have a light by your bed that is easy to reach. Do not use any sheets or blankets that are too big for your bed. They should not hang down onto the floor. Have a firm chair that has side arms. You can use this for support while you get dressed. Do not have throw rugs and other things on the floor that can make you trip. What can I do in the kitchen? Clean  up any spills right away. Avoid walking on wet floors. Keep items that you use a lot in easy-to-reach places. If you need to reach something above you, use a strong step stool that has a grab bar. Keep electrical cords out of the way. Do not use floor polish or wax that makes floors slippery. If you must use wax, use non-skid floor wax. Do not have throw rugs and other things on the floor that can make you trip. What can I do with my stairs? Do not leave any items on the stairs. Make sure that there are handrails on both sides of the stairs and use them. Fix handrails that are broken or loose. Make sure that handrails are as long as the stairways. Check any carpeting to make sure that it is firmly attached to the stairs. Fix any carpet that is loose or worn. Avoid having throw rugs at the top or bottom of the stairs. If you do have throw  rugs, attach them to the floor with carpet tape. Make sure that you have a light switch at the top of the stairs and the bottom of the stairs. If you do not have them, ask someone to add them for you. What else can I do to help prevent falls? Wear shoes that: Do not have high heels. Have rubber bottoms. Are comfortable and fit you well. Are closed at the toe. Do not wear sandals. If you use a stepladder: Make sure that it is fully opened. Do not climb a closed stepladder. Make sure that both sides of the stepladder are locked into place. Ask someone to hold it for you, if possible. Clearly mark and make sure that you can see: Any grab bars or handrails. First and last steps. Where the edge of each step is. Use tools that help you move around (mobility aids) if they are needed. These include: Canes. Walkers. Scooters. Crutches. Turn on the lights when you go into a dark area. Replace any light bulbs as soon as they burn out. Set up your furniture so you have a clear path. Avoid moving your furniture around. If any of your floors are uneven, fix them. If there are any pets around you, be aware of where they are. Review your medicines with your doctor. Some medicines can make you feel dizzy. This can increase your chance of falling. Ask your doctor what other things that you can do to help prevent falls. This information is not intended to replace advice given to you by your health care provider. Make sure you discuss any questions you have with your health care provider. Document Released: 08/15/2009 Document Revised: 03/26/2016 Document Reviewed: 11/23/2014 Elsevier Interactive Patient Education  2017 [employer].

## 2022-10-20 NOTE — Progress Notes (Signed)
Subjective:   Danielle George is a 71 y.o. female who presents for an Initial Medicare Annual Wellness Visit.  Review of Systems    No ROS.  Medicare Wellness Virtual Visit.  Visual/audio telehealth visit, UTA vital signs.   See social history for additional risk factors.   Cardiac Risk Factors include: advanced age (>52mn, >>32women);diabetes mellitus     Objective:    Today's Vitals   10/20/22 1102  Weight: 134 lb (60.8 kg)  Height: _0  (1.626 m)   Body mass index is 23 kg/m.     10/20/2022   10:48 AM 02/06/2022    6:25 PM  Advanced Directives  Does Patient Have a Medical Advance Directive? No No  Would patient like information on creating a medical advance directive? No - Patient declined No - Patient declined    Current Medications (verified) Outpatient Encounter Medications as of 10/20/2022  Medication Sig   alendronate (FOSAMAX) 70 MG tablet Take 1 tablet (70 mg total) by mouth every 7 (seven) days. Take with a full glass of water on an empty stomach.   aspirin EC 81 MG tablet Take 81 mg by mouth daily. Swallow whole.   hydrocortisone (ANUSOL-HC) 2.5 % rectal cream Place 1 Application rectally 2 (two) times daily.   meloxicam (MOBIC) 7.5 MG tablet Take 1 tablet (7.5 mg total) by mouth daily.   metFORMIN (GLUCOPHAGE) 500 MG tablet Take 1 tablet (500 mg total) by mouth daily.   metoprolol succinate (TOPROL-XL) 25 MG 24 hr tablet Take 25 mg by mouth daily.   omeprazole (PRILOSEC) 40 MG capsule Take 40 mg by mouth daily.   predniSONE (STERAPRED UNI-PAK 21 TAB) 10 MG (21) TBPK tablet Take as directed   rosuvastatin (CRESTOR) 10 MG tablet Take by mouth.   No facility-administered encounter medications on file as of 10/20/2022.    Allergies (verified) Penicillins   History: History reviewed. No pertinent past medical history. Past Surgical History:  Procedure Laterality Date   CHOLECYSTECTOMY     History reviewed. No pertinent family history. Social  History   Socioeconomic History   Marital status: Married    Spouse name: Not on file   Number of children: Not on file   Years of education: Not on file   Highest education level: Not on file  Occupational History   Not on file  Tobacco Use   Smoking status: Never   Smokeless tobacco: Never  Vaping Use   Vaping Use: Never used  Substance and Sexual Activity   Alcohol use: Not Currently   Drug use: Never   Sexual activity: Yes    Partners: Male  Other Topics Concern   Not on file  Social History Narrative   Not on file   Social Determinants of Health   Financial Resource Strain: Low Risk  (10/20/2022)   Overall Financial Resource Strain (CARDIA)    Difficulty of Paying Living Expenses: Not hard at all  Food Insecurity: No Food Insecurity (10/20/2022)   Hunger Vital Sign    Worried About Running Out of Food in the Last Year: Never true    RRosa Sanchezin the Last Year: Never true  Transportation Needs: No Transportation Needs (10/20/2022)   PRAPARE - THydrologist(Medical): No    Lack of Transportation (Non-Medical): No  Physical Activity: Not on file  Stress: No Stress Concern Present (10/20/2022)   [place]  Feeling of Stress : Not at all  Social Connections: Unknown (10/20/2022)   Social Connection and Isolation Panel [NHANES]    Frequency of Communication with Friends and Family: Not on file    Frequency of Social Gatherings with Friends and Family: Not on file    Attends Religious Services: Not on file    Active Member of Clubs or Organizations: Not on file    Attends Archivist Meetings: Not on file    Marital Status: Married    Tobacco Counseling Counseling given: Not Answered   Clinical Intake:  Pre-visit preparation completed: Yes       Nutrition Risk Assessment: Has the patient had any N/V/D within the last 2 months?  No  Does the  patient have any non-healing wounds?  No  Has the patient had any unintentional weight loss or weight gain?  No   Diabetes: Is the patient diabetic?  Yes  If diabetic, was a CBG obtained today?  No  Did the patient bring in their glucometer from home?  No  How often do you monitor your CBG's? Not at home.   Financial Strains and Diabetes Management: Are you having any financial strains with the device, your supplies or your medication? No .  Does the patient want to be seen by Chronic Care Management for management of their diabetes?  No  Would the patient like to be referred to a Nutritionist or for Diabetic Management?  No   How often do you need to have someone help you when you read instructions, pamphlets, or other written materials from your doctor or pharmacy?: 1 - Never    Interpreter Needed?: No      Activities of Daily Living    10/20/2022   10:50 AM  In your present state of health, do you have any difficulty performing the following activities:  Hearing? 0  Vision? 0  Difficulty concentrating or making decisions? 0  Walking or climbing stairs? 0  Dressing or bathing? 0  Doing errands, shopping? 0  Preparing Food and eating ? N  Using the Toilet? N  In the past six months, have you accidently leaked urine? N  Do you have problems with loss of bowel control? N  Managing your Medications? N  Managing your Finances? N  Housekeeping or managing your Housekeeping? N    Patient Care Team: Eugenia Pancoast, FNP as PCP - General (Family Medicine)  Indicate any recent Medical Services you may have received from other than [facility] providers in the past year (date may be approximate).     Assessment:   This is a routine wellness examination for Danielle George.  I connected with  Logan Bores on 10/20/22 by a audio enabled telemedicine application and verified that I am speaking with the correct person using two identifiers.  Patient Location: Home  Provider Location:  Office/Clinic  I discussed the limitations of evaluation and management by telemedicine. The patient expressed understanding and agreed to proceed.   Hearing/Vision screen Hearing Screening - Comments:: Patient is able to hear conversational tones without difficulty.  No issues reported.   Vision Screening - Comments:: Followed by [facility] Specialist Wears corrective lenses They have seen their ophthalmologist in the last 12 months.    Dietary issues and exercise activities discussed: Current Exercise Habits: Home exercise routine, Type of exercise: walking, Intensity: Mild Healthy diet   Goals Addressed             This Visit's Progress  Maintain healthy lifestyle       Stay active Healthy lifestyle       Depression Screen    10/20/2022   10:53 AM 08/31/2022    8:37 AM  PHQ 2/9 Scores  PHQ - 2 Score 0 0    Fall Risk    10/20/2022   10:52 AM  [place] in the past year? 0  Number falls in past yr: 0  Injury with Fall? 0  Risk for fall due to : No Fall Risks  Follow up Falls evaluation completed;Falls prevention discussed    FALL RISK PREVENTION PERTAINING TO THE HOME: Home free of loose throw rugs in walkways, pet beds, electrical cords, etc? Yes  Adequate lighting in your home to reduce risk of falls? Yes   ASSISTIVE DEVICES UTILIZED TO PREVENT FALLS: Life alert? No  Use of a cane, walker or w/c? No   TIMED UP AND GO: Was the test performed? No .   Cognitive Function:  Patient is alert and oriented.  6CIT complete.  Patient made aware follow up with pcp for additional cognitive screening available. Deferred at this time.       10/20/2022   10:57 AM  6CIT Screen  What Year? 0 points  What month? 0 points  What time? 0 points  Count back from 20 0 points  Months in reverse 2 points  Repeat phrase 10 points  Total Score 12 points    Immunizations Immunization History  Administered Date(s) Administered   Fluad Quad(high  Dose 65+) 08/31/2022   PFIZER(Purple Top)SARS-COV-2 Vaccination 12/10/2019, 01/04/2020   TDAP status: Due, Education has been provided regarding the importance of this vaccine. Advised may receive this vaccine at local pharmacy or Health Dept. Aware to provide a copy of the vaccination record if obtained from local pharmacy or Health Dept. Verbalized acceptance and understanding. Deferred.   Pneumococcal vaccine status: Due, Education has been provided regarding the importance of this vaccine. Advised may receive this vaccine at local pharmacy or Health Dept. Aware to provide a copy of the vaccination record if obtained from local pharmacy or Health Dept. Verbalized acceptance and understanding. Notes UTD.   Screening Tests Health Maintenance  Topic Date Due   FOOT EXAM  Never done   Diabetic kidney evaluation - Urine ACR  Never done   COVID-19 Vaccine (3 - Pfizer risk series) 11/05/2022 (Originally 02/01/2020)   Pneumonia Vaccine 73+ Years old (1 - PCV) 10/21/2023 (Originally 08/20/2018)   Hepatitis C Screening  10/21/2023 (Originally 08/21/1969)   HEMOGLOBIN A1C  03/02/2023   OPHTHALMOLOGY EXAM  08/03/2023   Diabetic kidney evaluation - eGFR measurement  09/01/2023   Medicare Annual Wellness (AWV)  10/21/2023   MAMMOGRAM  03/19/2024   COLONOSCOPY (Pts 45-69yr Insurance coverage will need to be confirmed)  04/07/2032   INFLUENZA VACCINE  Completed   DEXA SCAN  Completed   Zoster Vaccines- Shingrix  Completed   HPV VACCINES  Aged Out   DTaP/Tdap/Td  Discontinued   Health Maintenance Health Maintenance Due  Topic Date Due   FOOT EXAM  Never done   Diabetic kidney evaluation - Urine ACR  Never done    Lung Cancer Screening: (Low Dose CT Chest recommended if Age 69-80years, 30 pack-year currently smoking OR have quit w/in 15years.) does not qualify.   Hepatitis C Screening: declined.   Vision Screening: Recommended annual ophthalmology exams for early detection of glaucoma and  other disorders of the eye.  Dental Screening:  Recommended annual dental exams for proper oral hygiene.  Community Resource Referral / Chronic Care Management: CRR required this visit?  No   CCM required this visit?  No      Plan:     I have personally reviewed and noted the following in the patient's chart:   Medical and social history Use of alcohol, tobacco or illicit drugs  Current medications and supplements including opioid prescriptions. Patient is not currently taking opioid prescriptions. Functional ability and status Nutritional status Physical activity Advanced directives List of other physicians Hospitalizations, surgeries, and ER visits in previous 12 months Vitals Screenings to include cognitive, depression, and falls Referrals and appointments  In addition, I have reviewed and discussed with patient certain preventive protocols, quality metrics, and best practice recommendations. A written personalized care plan for preventive services as well as general preventive health recommendations were provided to patient.     Leta Jungling, LPN   [id number]

## 2022-11-12 ENCOUNTER — Ambulatory Visit: Payer: Medicare PPO | Admitting: Family

## 2022-11-12 ENCOUNTER — Encounter: Payer: Self-pay | Admitting: Family

## 2022-11-12 VITALS — BP 130/82 | HR 97 | Temp 98.0°F | Ht 64.0 in | Wt 128.0 lb

## 2022-11-12 DIAGNOSIS — E119 Type 2 diabetes mellitus without complications: Secondary | ICD-10-CM

## 2022-11-12 DIAGNOSIS — R634 Abnormal weight loss: Secondary | ICD-10-CM | POA: Diagnosis not present

## 2022-11-12 DIAGNOSIS — K30 Functional dyspepsia: Secondary | ICD-10-CM | POA: Diagnosis not present

## 2022-11-12 DIAGNOSIS — R1012 Left upper quadrant pain: Secondary | ICD-10-CM

## 2022-11-12 DIAGNOSIS — R1013 Epigastric pain: Secondary | ICD-10-CM | POA: Diagnosis not present

## 2022-11-12 DIAGNOSIS — T887XXA Unspecified adverse effect of drug or medicament, initial encounter: Secondary | ICD-10-CM | POA: Insufficient documentation

## 2022-11-12 LAB — COMPREHENSIVE METABOLIC PANEL
ALT: 17 U/L (ref 0–35)
AST: 19 U/L (ref 0–37)
Albumin: 4.4 g/dL (ref 3.5–5.2)
Alkaline Phosphatase: 55 U/L (ref 39–117)
BUN: 20 mg/dL (ref 6–23)
CO2: 30 mEq/L (ref 19–32)
Calcium: 10 mg/dL (ref 8.4–10.5)
Chloride: 103 mEq/L (ref 96–112)
Creatinine, Ser: 1.05 mg/dL (ref 0.40–1.20)
GFR: 54.32 mL/min — ABNORMAL LOW (ref >60.00)
Glucose, Bld: 179 mg/dL — ABNORMAL HIGH (ref 70–99)
Potassium: 3.9 mEq/L (ref 3.5–5.1)
Sodium: 141 mEq/L (ref 135–145)
Total Bilirubin: 0.7 mg/dL (ref 0.2–1.2)
Total Protein: 6.5 g/dL (ref 6.0–8.3)

## 2022-11-12 LAB — MICROALBUMIN / CREATININE URINE RATIO
Creatinine,U: 183.5 mg/dL
Microalb Creat Ratio: 1.5 mg/g (ref 0.0–30.0)
Microalb, Ur: 2.8 mg/dL — ABNORMAL HIGH (ref 0.0–1.9)

## 2022-11-12 LAB — HEMOGLOBIN A1C: Hgb A1c MFr Bld: 6.5 % (ref 4.6–6.5)

## 2022-11-12 LAB — LIPASE: Lipase: 27 U/L (ref 11.0–59.0)

## 2022-11-12 LAB — AMYLASE: Amylase: 51 U/L (ref 27–131)

## 2022-11-12 LAB — TSH: TSH: 1.32 u[IU]/mL (ref 0.35–5.50)

## 2022-11-12 NOTE — Assessment & Plan Note (Signed)
Stop metformin due to s/e  Ordered hga1c and urine microalbumin today pending results. Work on diabetic diet and exercise as tolerated. Yearly foot exam, and annual eye exam.

## 2022-11-12 NOTE — Assessment & Plan Note (Signed)
Likely form metformin, advised pt to d/ c and see if appetite returns/improves.

## 2022-11-12 NOTE — Patient Instructions (Signed)
  Stop metformin, see if appetite comes back, if no improvement then please let me know.    Regards,   Eugenia Pancoast FNP-C

## 2022-11-12 NOTE — Assessment & Plan Note (Signed)
Suspected from metformin however r/o pancreatitis/other etiologies with cmp lipase and amylase.

## 2022-11-12 NOTE — Progress Notes (Signed)
Established Patient Office Visit  Subjective:  Patient ID: Danielle George, female    DOB: 10-21-1951  Age: 72 y.o. MRN: [id number]  CC:  Chief Complaint  Patient presents with   Medical Management of Chronic Issues    Wants to discuss DM meds.     HPI Danielle George is here today for follow up.   Pt is with acute concerns.  DM2: metformin, not tolerating, and losing weight as well. Has taken it intermittently in the last few weeks. This was also causing a decreased appetite.  Lab Results  Component Value Date   HGBA1C 6.8 (H) 08/31/2022   Wt Readings from Last 3 Encounters:  11/12/22 128 lb (58.1 kg)  10/20/22 134 lb (60.8 kg)  09/02/22 134 lb 2 oz (60.8 kg)     No past medical history on file.  Past Surgical History:  Procedure Laterality Date   CHOLECYSTECTOMY      No family history on file.  Social History   Socioeconomic History   Marital status: Married    Spouse name: Not on file   Number of children: Not on file   Years of education: Not on file   Highest education level: Not on file  Occupational History   Not on file  Tobacco Use   Smoking status: Never   Smokeless tobacco: Never  Vaping Use   Vaping Use: Never used  Substance and Sexual Activity   Alcohol use: Not Currently   Drug use: Never   Sexual activity: Yes    Partners: Male  Other Topics Concern   Not on file  Social History Narrative   Not on file   Social Determinants of Health   Financial Resource Strain: Low Risk  (10/20/2022)   Overall Financial Resource Strain (CARDIA)    Difficulty of Paying Living Expenses: Not hard at all  Food Insecurity: No Food Insecurity (10/20/2022)   Hunger Vital Sign    Worried About Running Out of Food in the Last Year: Never true    [place] in the Last Year: Never true  Transportation Needs: No Transportation Needs (10/20/2022)   PRAPARE - Hydrologist (Medical): No    Lack of Transportation  (Non-Medical): No  Physical Activity: Not on file  Stress: No Stress Concern Present (10/20/2022)   [place]    Feeling of Stress : Not at all  Social Connections: Unknown (10/20/2022)   Social Connection and Isolation Panel [NHANES]    Frequency of Communication with Friends and Family: Not on file    Frequency of Social Gatherings with Friends and Family: Not on file    Attends Religious Services: Not on file    Active Member of Clubs or Organizations: Not on file    Attends Archivist Meetings: Not on file    Marital Status: Married  Intimate Partner Violence: Not At Risk (10/20/2022)   Humiliation, Afraid, Rape, and Kick questionnaire    Fear of Current or Ex-Partner: No    Emotionally Abused: No    Physically Abused: No    Sexually Abused: No    Outpatient Medications Prior to Visit  Medication Sig Dispense Refill   alendronate (FOSAMAX) 70 MG tablet Take 1 tablet (70 mg total) by mouth every 7 (seven) days. Take with a full glass of water on an empty stomach. 4 tablet 11   aspirin EC 81 MG tablet Take 81 mg  by mouth daily. Swallow whole.     hydrocortisone (ANUSOL-HC) 2.5 % rectal cream Place 1 Application rectally 2 (two) times daily. 30 g 0   metoprolol succinate (TOPROL-XL) 25 MG 24 hr tablet Take 25 mg by mouth daily.     omeprazole (PRILOSEC) 40 MG capsule Take 40 mg by mouth daily.     rosuvastatin (CRESTOR) 10 MG tablet Take by mouth.     metFORMIN (GLUCOPHAGE) 500 MG tablet Take 1 tablet (500 mg total) by mouth daily. 90 tablet 1   meloxicam (MOBIC) 7.5 MG tablet Take 1 tablet (7.5 mg total) by mouth daily. 90 tablet 0   predniSONE (STERAPRED UNI-PAK 21 TAB) 10 MG (21) TBPK tablet Take as directed 21 tablet 3   No facility-administered medications prior to visit.    Allergies  Allergen Reactions   Metformin And Related Other (See Comments)    Gi upset, losing weight   Penicillins      ROS: Pertinent symptoms negative unless otherwise noted in HPI      Objective:    Physical Exam Abdominal:     General: Abdomen is flat. Bowel sounds are normal.     Tenderness: There is abdominal tenderness in the epigastric area and left upper quadrant. There is no guarding.      BP 130/82   Pulse 97   Temp 98 F (36.7 C) (Temporal)   Ht 5\' 4"  (1.626 m)   Wt 128 lb (58.1 kg)   SpO2 97%   BMI 21.97 kg/m  Wt Readings from Last 3 Encounters:  11/12/22 128 lb (58.1 kg)  10/20/22 134 lb (60.8 kg)  09/02/22 134 lb 2 oz (60.8 kg)     Health Maintenance Due  Topic Date Due   FOOT EXAM  Never done   Diabetic kidney evaluation - Urine ACR  Never done   COVID-19 Vaccine (3 - Pfizer risk series) 02/01/2020    There are no preventive care reminders to display for this patient.  Lab Results  Component Value Date   TSH 1.52 01/09/2022   Lab Results  Component Value Date   WBC 7.6 08/31/2022   HGB 13.2 08/31/2022   HCT 39.2 08/31/2022   MCV 93.5 08/31/2022   PLT 158.0 08/31/2022   Lab Results  Component Value Date   NA 138 08/31/2022   K 4.2 08/31/2022   CO2 24 08/31/2022   GLUCOSE 192 (H) 08/31/2022   BUN 21 08/31/2022   CREATININE 0.99 08/31/2022   BILITOT 0.9 02/06/2022   ALKPHOS 51 02/06/2022   AST 24 02/06/2022   ALT 19 02/06/2022   PROT 6.9 02/06/2022   ALBUMIN 4.0 02/06/2022   CALCIUM 9.4 08/31/2022   ANIONGAP 8 02/06/2022   GFR 58.38 (L) 08/31/2022   Lab Results  Component Value Date   CHOL 165 01/09/2022   Lab Results  Component Value Date   HDL 73.50 01/09/2022   Lab Results  Component Value Date   LDLCALC 77 01/09/2022   Lab Results  Component Value Date   TRIG 71.0 01/09/2022   Lab Results  Component Value Date   CHOLHDL 2 01/09/2022   Lab Results  Component Value Date   HGBA1C 6.8 (H) 08/31/2022      Assessment & Plan:   Problem List Items Addressed This Visit       Endocrine   Type 2 diabetes mellitus without  complication, without long-term current use of insulin ([place]) - Primary    Stop metformin due to s/e  Ordered hga1c and urine microalbumin today pending results. Work on diabetic diet and exercise as tolerated. Yearly foot exam, and annual eye exam.        Relevant Orders   Microalbumin / creatinine urine ratio   Hemoglobin A1c   Comprehensive metabolic panel     Other   Epigastric abdominal pain    Suspected from metformin however r/o pancreatitis/other etiologies with cmp lipase and amylase.       Relevant Orders   Lipase   Amylase   Upset stomach   Side effect of medication    Likely form metformin, advised pt to d/ c and see if appetite returns/improves.       Other Visit Diagnoses     Left upper quadrant abdominal pain       Relevant Orders   Lipase   Amylase   Abnormal weight loss       Relevant Orders   Lipase   Amylase   Hemoglobin A1c   TSH       No orders of the defined types were placed in this encounter.   Follow-up: Return in about 1 month (around 12/13/2022) for f/u weight loss, keep appt already scheduled.    Mort Sawyers, FNP

## 2022-11-13 ENCOUNTER — Other Ambulatory Visit: Payer: Self-pay | Admitting: Family

## 2022-11-13 DIAGNOSIS — R809 Proteinuria, unspecified: Secondary | ICD-10-CM

## 2022-11-13 MED ORDER — LOSARTAN POTASSIUM 25 MG PO TABS: 25.0000 mg | ORAL_TABLET | Freq: Every day | ORAL | 1 refills | Status: DC

## 2022-11-13 NOTE — Progress Notes (Signed)
Urine microalbumin positive  I want to start losartan 25 mg once daily to help protect kidneys from future damage, positive urine microalbumin typically is early signs of kidney stressed from long term hypertension and or diabetes.   Kidney function stable.

## 2022-11-26 ENCOUNTER — Other Ambulatory Visit: Payer: Self-pay | Admitting: Family

## 2022-11-26 DIAGNOSIS — M1611 Unilateral primary osteoarthritis, right hip: Secondary | ICD-10-CM

## 2022-11-26 NOTE — Telephone Encounter (Signed)
Ask pt is she is still taking meloxicam? Looks like it was d/c at one point as pt was no longer taking.

## 2022-11-30 NOTE — Telephone Encounter (Signed)
Unable to reach patient. Left voicemail to return call to our office.   

## 2022-12-01 NOTE — Telephone Encounter (Signed)
Patient no longer needs medication

## 2022-12-01 NOTE — Telephone Encounter (Signed)
Patient called in stating that she is no longer taking meloxicam,her hip has been fine.

## 2022-12-07 ENCOUNTER — Encounter: Payer: Self-pay | Admitting: Family

## 2022-12-07 ENCOUNTER — Ambulatory Visit: Payer: Medicare PPO | Admitting: Family

## 2022-12-07 VITALS — BP 122/76 | HR 98 | Temp 98.6°F | Ht 64.0 in | Wt 129.4 lb

## 2022-12-07 DIAGNOSIS — E119 Type 2 diabetes mellitus without complications: Secondary | ICD-10-CM

## 2022-12-07 DIAGNOSIS — R1013 Epigastric pain: Secondary | ICD-10-CM | POA: Insufficient documentation

## 2022-12-07 DIAGNOSIS — R143 Flatulence: Secondary | ICD-10-CM | POA: Diagnosis not present

## 2022-12-07 DIAGNOSIS — R197 Diarrhea, unspecified: Secondary | ICD-10-CM | POA: Insufficient documentation

## 2022-12-07 DIAGNOSIS — K449 Diaphragmatic hernia without obstruction or gangrene: Secondary | ICD-10-CM | POA: Diagnosis not present

## 2022-12-07 DIAGNOSIS — K219 Gastro-esophageal reflux disease without esophagitis: Secondary | ICD-10-CM

## 2022-12-07 MED ORDER — OMEPRAZOLE 20 MG PO CPDR: 20.0000 mg | DELAYED_RELEASE_CAPSULE | Freq: Two times a day (BID) | ORAL | 1 refills | Status: DC

## 2022-12-07 MED ORDER — OMEPRAZOLE 40 MG PO CPDR: 40.0000 mg | DELAYED_RELEASE_CAPSULE | Freq: Every day | ORAL | 5 refills | Status: DC

## 2022-12-07 NOTE — Patient Instructions (Signed)
   I have created an order for lab work today during our visit.  Please schedule an appointment on your way out to return to the lab at your convenience.  I will reach out to you in regards to the labs when I receive the results.    Regards,   Eugenia Pancoast FNP-C

## 2022-12-07 NOTE — Progress Notes (Signed)
Established Patient Office Visit  Subjective:   Patient ID: Danielle George, female    DOB: Jan 08, 1951  Age: 72 y.o. MRN: [id number]  CC:  Chief Complaint  Patient presents with   Medical Management of Chronic Issues    HPI: Danielle George is a 72 y.o. female presenting on 12/07/2022 for Medical Management of Chronic Issues   HPI  DM2: d/c metformin and appetite is increasing a bit. No longer with as much GI upset. Pt no longer with epigastric tenderness. She has gained a pound since last visit.   Lab Results  Component Value Date   HGBA1C 6.5 11/12/2022   GERD: ongoing, still on omeprazole 20 mg twice daily. Has tried to decrease but rebound GER dis too much to stay off of it. No epigastric pain today.   Wt Readings from Last 3 Encounters:  12/07/22 129 lb 6.4 oz (58.7 kg)  11/12/22 128 lb (58.1 kg)  10/20/22 134 lb (60.8 kg)   Positive microalbumin, on losartan 25 mg once daily. Tolerating well.    Diarrhea: last week with some fecal incontinence. Didn't realize she was going to have bowel and then noticed was in her underwear. Not overly frequently. Every few months however this will occur.   ROS: Negative unless specifically indicated above in HPI.   Relevant past medical history reviewed and updated as indicated.   Allergies and medications reviewed and updated.   Current Outpatient Medications:    alendronate (FOSAMAX) 70 MG tablet, Take 1 tablet (70 mg total) by mouth every 7 (seven) days. Take with a full glass of water on an empty stomach., Disp: 4 tablet, Rfl: 11   aspirin EC 81 MG tablet, Take 81 mg by mouth daily. Swallow whole., Disp: , Rfl:    losartan (COZAAR) 25 MG tablet, Take 1 tablet (25 mg total) by mouth daily., Disp: 90 tablet, Rfl: 1   metoprolol succinate (TOPROL-XL) 25 MG 24 hr tablet, Take 25 mg by mouth daily., Disp: , Rfl:    omeprazole (PRILOSEC) 20 MG capsule, Take 1 capsule (20 mg total) by mouth 2 (two) times daily before a meal.,  Disp: 180 capsule, Rfl: 1   rosuvastatin (CRESTOR) 10 MG tablet, Take by mouth., Disp: , Rfl:   Allergies  Allergen Reactions   Metformin And Related Other (See Comments)    Gi upset, losing weight   Penicillins     Objective:   BP 122/76   Pulse 98   Temp 98.6 F (37 C) (Temporal)   Ht 5\' 4"  (1.626 m)   Wt 129 lb 6.4 oz (58.7 kg)   SpO2 99%   BMI 22.21 kg/m    Physical Exam Constitutional:      General: She is not in acute distress.    Appearance: Normal appearance. She is normal weight. She is not ill-appearing, toxic-appearing or diaphoretic.  HENT:     Head: Normocephalic.  Cardiovascular:     Rate and Rhythm: Normal rate.  Pulmonary:     Effort: Pulmonary effort is normal.  Abdominal:     General: Bowel sounds are normal. There is no distension.     Palpations: There is no mass.     Tenderness: There is abdominal tenderness in the epigastric area and left upper quadrant. There is no guarding or rebound.     Hernia: No hernia is present.  Musculoskeletal:        General: Normal range of motion.  Neurological:     General: No focal  deficit present.     Mental Status: She is alert and oriented to person, place, and time. Mental status is at baseline.  Psychiatric:        Mood and Affect: Mood normal.        Behavior: Behavior normal.        Thought Content: Thought content normal.        Judgment: Judgment normal.     Assessment & Plan:  Type 2 diabetes mellitus without complication, without long-term current use of insulin ([place]) Assessment & Plan: Continue with diabetic diet  Stay off of metformin A1c to be repeated anytime after April  Goal A1c <7  Orders: -     Hemoglobin A1c; Future  Gastroesophageal reflux disease without esophagitis -     Omeprazole; Take 1 capsule (20 mg total) by mouth 2 (two) times daily before a meal.  Dispense: 180 capsule; Refill: 1  Epigastric pain -     H. pylori breath test -     Alpha-Gal Panel -     Celiac Pnl 2  rflx Endomysial Ab Ttr  Flatulence -     H. pylori breath test -     Alpha-Gal Panel -     Celiac Pnl 2 rflx Endomysial Ab Ttr  Diarrhea, unspecified type Assessment & Plan: Worsening with some fecal incontinence at times Stool cultures ordered and pending  R/o celiac with lab work pending results.  Consider GI workup if negative findings (pt with GI in [place])  Orders: -     Celiac Pnl 2 rflx Endomysial Ab Ttr -     Giardia antigen; Future -     Gastrointestinal Pathogen Pnl RT, PCR; Future -     C. difficile GDH and Toxin A/B; Future  Hiatal hernia Assessment & Plan: Increased gerd symptoms  F/u with GI if ongoing, especially with epigastric tenderness as possible need for EGD  Continue omeprazole 20 mg bid  H pylori and alpha gal ordered today for ongoing epigastric tenderness       Follow up plan: Return for lab only appointment anytime after 02/11/23, then f/u in office once back from [place] .  Eugenia Pancoast, FNP

## 2022-12-07 NOTE — Assessment & Plan Note (Signed)
Increased gerd symptoms  F/u with GI if ongoing, especially with epigastric tenderness as possible need for EGD  Continue omeprazole 20 mg bid  H pylori and alpha gal ordered today for ongoing epigastric tenderness

## 2022-12-07 NOTE — Assessment & Plan Note (Signed)
Did d/w her long term PPI  Goal is to decrease omeprazole

## 2022-12-07 NOTE — Assessment & Plan Note (Addendum)
Continue with diabetic diet  Stay off of metformin A1c to be repeated anytime after April  Goal A1c <7

## 2022-12-07 NOTE — Assessment & Plan Note (Signed)
Worsening with some fecal incontinence at times Stool cultures ordered and pending  R/o celiac with lab work pending results.  Consider GI workup if negative findings (pt with GI in [place])

## 2022-12-08 ENCOUNTER — Other Ambulatory Visit: Payer: Medicare PPO

## 2022-12-08 DIAGNOSIS — R197 Diarrhea, unspecified: Secondary | ICD-10-CM | POA: Diagnosis not present

## 2022-12-08 LAB — HEMOGLOBIN A1C: Hgb A1c MFr Bld: 6.3 % (ref 4.6–6.5)

## 2022-12-10 LAB — C. DIFFICILE GDH AND TOXIN A/B
GDH ANTIGEN: NOT DETECTED
MICRO NUMBER:: 14525871
SPECIMEN QUALITY:: ADEQUATE
TOXIN A AND B: NOT DETECTED

## 2022-12-10 LAB — GASTROINTESTINAL PATHOGEN PNL
CampyloBacter Group: NOT DETECTED
Norovirus GI/GII: NOT DETECTED
Rotavirus A: NOT DETECTED
Salmonella species: NOT DETECTED
Shiga Toxin 1: NOT DETECTED
Shiga Toxin 2: NOT DETECTED
Shigella Species: NOT DETECTED
Vibrio Group: NOT DETECTED
Yersinia enterocolitica: NOT DETECTED

## 2022-12-10 LAB — GIARDIA ANTIGEN
MICRO NUMBER:: 14525818
RESULT:: NOT DETECTED
SPECIMEN QUALITY:: ADEQUATE

## 2022-12-10 NOTE — Progress Notes (Signed)
Please call pt to have this test redone.   Terri do I need to put in another order?

## 2022-12-10 NOTE — Progress Notes (Signed)
Can we see why this was not performed?

## 2022-12-11 LAB — ALPHA-GAL PANEL
Allergen, Mutton, f88: <0.1 kU/L
Allergen, Pork, f26: <0.1 kU/L
Beef: <0.1 kU/L
CLASS: 0
CLASS: 0
Class: 0
GALACTOSE-ALPHA-1,3-GALACTOSE IGE*: <0.1 kU/L (ref <0.10)

## 2022-12-11 LAB — INTERPRETATION:

## 2022-12-17 LAB — CELIAC PNL 2 RFLX ENDOMYSIAL AB TTR
(tTG) Ab, IgA: <1 U/mL
(tTG) Ab, IgG: <1 U/mL
Endomysial Ab IgA: NEGATIVE
Gliadin IgA: 2.4 U/mL
Gliadin IgG: <1 U/mL
Immunoglobulin A: 197 mg/dL (ref 70–320)

## 2022-12-17 LAB — H. PYLORI BREATH TEST

## 2023-01-10 ENCOUNTER — Other Ambulatory Visit: Payer: Self-pay | Admitting: Family

## 2023-01-11 ENCOUNTER — Other Ambulatory Visit (HOSPITAL_COMMUNITY): Payer: Self-pay

## 2023-01-11 MED ORDER — ROSUVASTATIN CALCIUM 10 MG PO TABS
10.0000 mg | ORAL_TABLET | Freq: Every day | ORAL | 3 refills | Status: DC
  Filled 2023-01-11 – 2023-01-16 (×2): qty 90, 90d supply, fill #0

## 2023-01-16 ENCOUNTER — Other Ambulatory Visit (HOSPITAL_COMMUNITY): Payer: Self-pay

## 2023-02-17 ENCOUNTER — Ambulatory Visit: Payer: Medicare PPO | Admitting: Family

## 2023-03-02 ENCOUNTER — Ambulatory Visit: Payer: Medicare PPO | Admitting: Family

## 2023-03-25 DIAGNOSIS — H53143 Visual discomfort, bilateral: Secondary | ICD-10-CM | POA: Diagnosis not present

## 2023-03-25 DIAGNOSIS — R41842 Visuospatial deficit: Secondary | ICD-10-CM | POA: Diagnosis not present

## 2023-04-12 IMAGING — DX DG HIP (WITH OR WITHOUT PELVIS) 2-3V*R*
3 series · 3 of 3 positions shown · non-contrast
Comparison: None.

CLINICAL DATA: Right hip and sacroiliac joint pain. Chronic.
Exacerbated by a hike.

EXAM:
DG HIP (WITH OR WITHOUT PELVIS) 2-3V RIGHT

[pelvis ap]
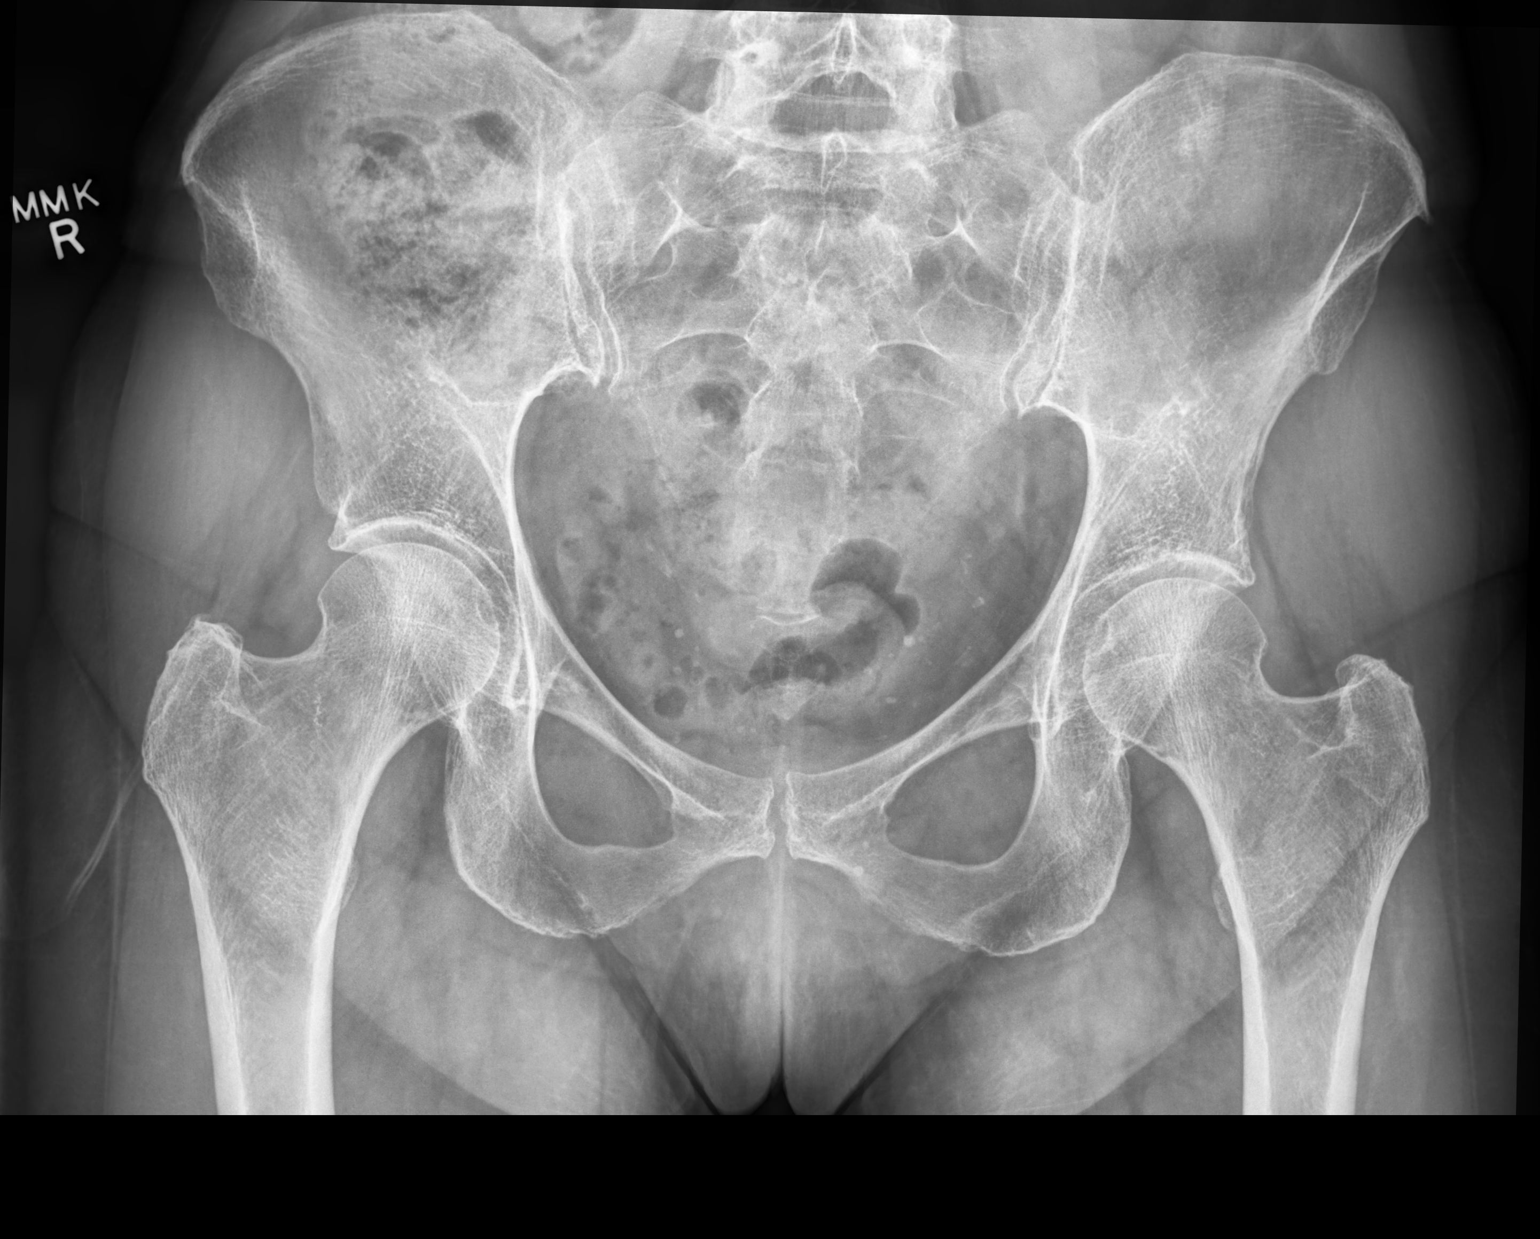

[hip ap]
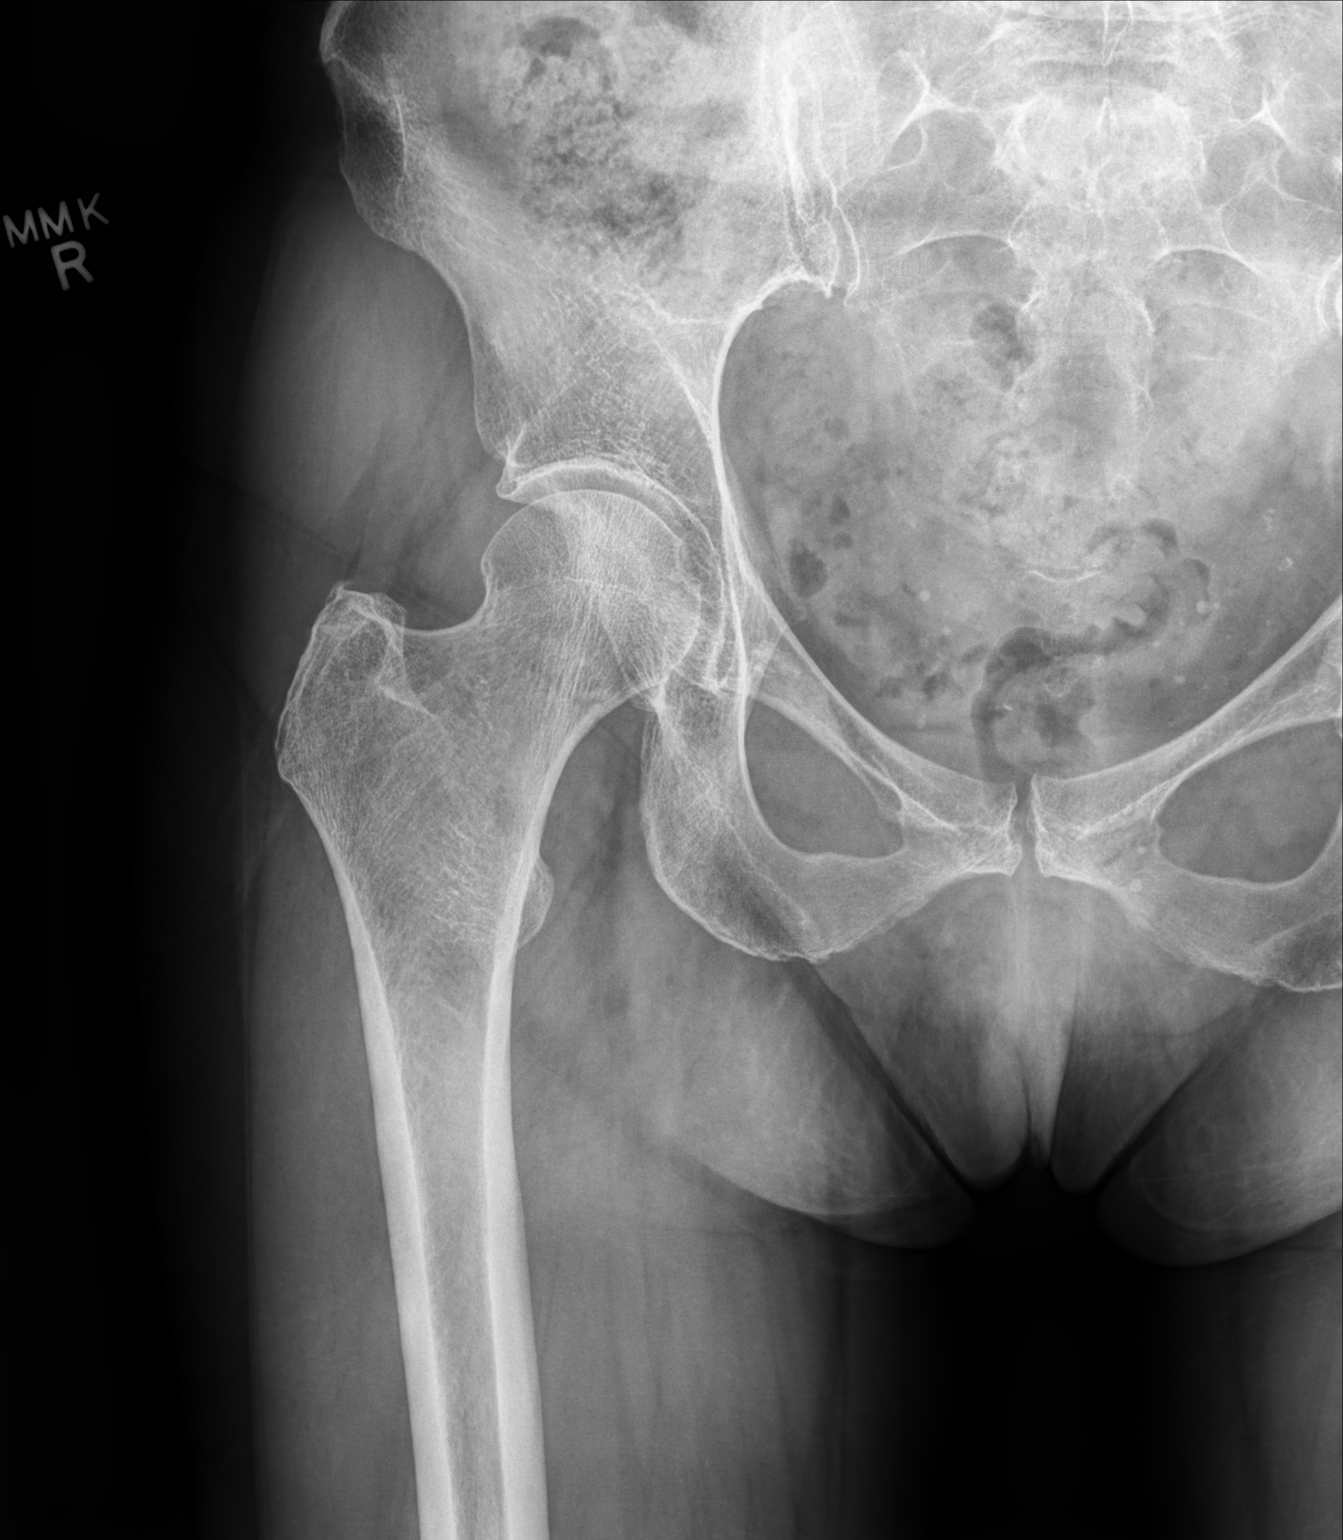

[hip (frog leg)]
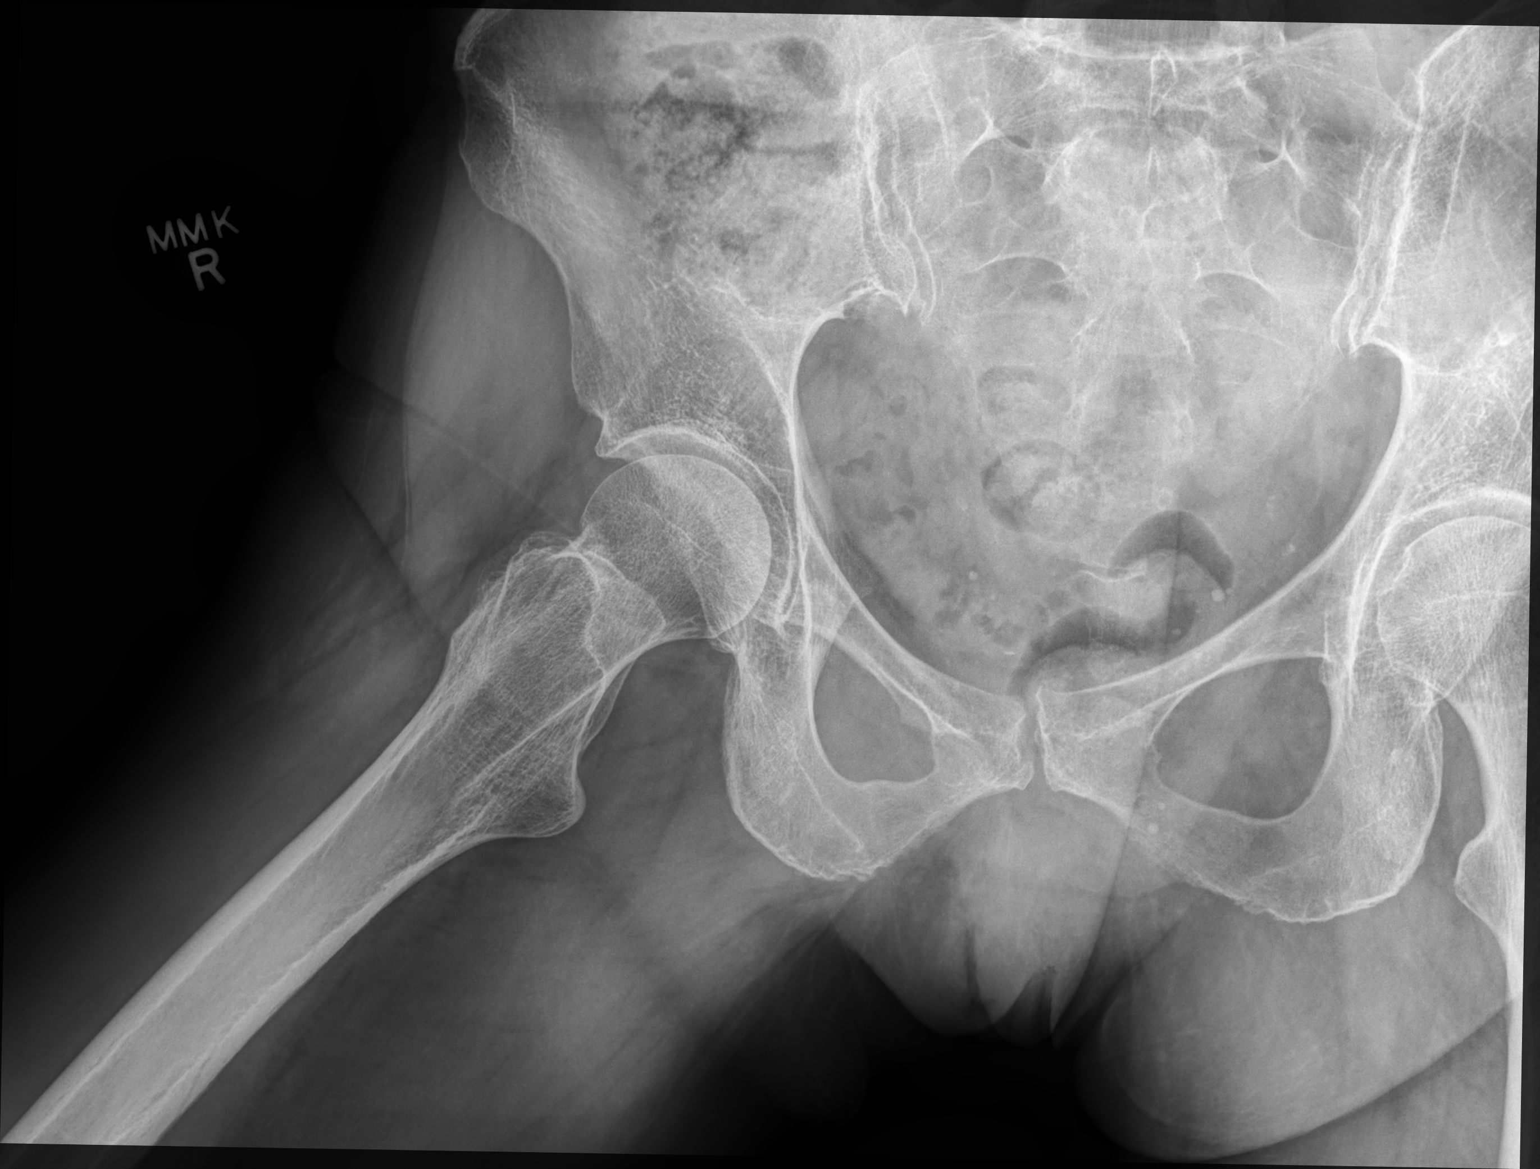

[3 of 3 positions shown; findings below may reference images not displayed]

FINDINGS: Mildly decreased bone mineralization. Mild pubic symphysis joint
space narrowing and peripheral osteophytosis. Minimal superior
bilateral femoroacetabular joint space narrowing and superolateral
bilateral acetabular degenerative osteophytosis. The bilateral
sacroiliac joint spaces are maintained. Vascular phleboliths overlie
the pelvis. No acute fracture or dislocation.
IMPRESSION: Mild bilateral femoroacetabular and mild pubic symphysis
osteoarthritis.

## 2023-05-13 ENCOUNTER — Other Ambulatory Visit: Payer: Self-pay | Admitting: Family

## 2023-05-13 DIAGNOSIS — R809 Proteinuria, unspecified: Secondary | ICD-10-CM

## 2023-06-15 ENCOUNTER — Other Ambulatory Visit: Payer: Self-pay | Admitting: Family

## 2023-06-15 DIAGNOSIS — K219 Gastro-esophageal reflux disease without esophagitis: Secondary | ICD-10-CM

## 2023-07-26 DIAGNOSIS — H53143 Visual discomfort, bilateral: Secondary | ICD-10-CM | POA: Diagnosis not present

## 2023-07-26 DIAGNOSIS — R41842 Visuospatial deficit: Secondary | ICD-10-CM | POA: Diagnosis not present

## 2023-07-26 DIAGNOSIS — H53453 Other localized visual field defect, bilateral: Secondary | ICD-10-CM | POA: Diagnosis not present

## 2023-07-26 DIAGNOSIS — H5052 Exophoria: Secondary | ICD-10-CM | POA: Diagnosis not present

## 2023-10-13 ENCOUNTER — Encounter: Payer: Self-pay | Admitting: Family

## 2023-10-13 ENCOUNTER — Encounter: Payer: Self-pay | Admitting: *Deleted

## 2023-10-13 ENCOUNTER — Ambulatory Visit: Payer: Medicare PPO | Admitting: Family

## 2023-10-13 VITALS — BP 122/86 | HR 101 | Temp 98.4°F | Ht 64.0 in | Wt 128.8 lb

## 2023-10-13 DIAGNOSIS — K219 Gastro-esophageal reflux disease without esophagitis: Secondary | ICD-10-CM

## 2023-10-13 DIAGNOSIS — M25551 Pain in right hip: Secondary | ICD-10-CM

## 2023-10-13 DIAGNOSIS — R413 Other amnesia: Secondary | ICD-10-CM

## 2023-10-13 DIAGNOSIS — D229 Melanocytic nevi, unspecified: Secondary | ICD-10-CM | POA: Insufficient documentation

## 2023-10-13 DIAGNOSIS — E119 Type 2 diabetes mellitus without complications: Secondary | ICD-10-CM | POA: Diagnosis not present

## 2023-10-13 DIAGNOSIS — M816 Localized osteoporosis [Lequesne]: Secondary | ICD-10-CM | POA: Diagnosis not present

## 2023-10-13 DIAGNOSIS — Z8673 Personal history of transient ischemic attack (TIA), and cerebral infarction without residual deficits: Secondary | ICD-10-CM

## 2023-10-13 DIAGNOSIS — E538 Deficiency of other specified B group vitamins: Secondary | ICD-10-CM

## 2023-10-13 DIAGNOSIS — E782 Mixed hyperlipidemia: Secondary | ICD-10-CM | POA: Diagnosis not present

## 2023-10-13 DIAGNOSIS — F0150 Vascular dementia without behavioral disturbance: Secondary | ICD-10-CM

## 2023-10-13 DIAGNOSIS — Z1231 Encounter for screening mammogram for malignant neoplasm of breast: Secondary | ICD-10-CM

## 2023-10-13 DIAGNOSIS — I10 Essential (primary) hypertension: Secondary | ICD-10-CM | POA: Diagnosis not present

## 2023-10-13 DIAGNOSIS — Z0000 Encounter for general adult medical examination without abnormal findings: Secondary | ICD-10-CM

## 2023-10-13 DIAGNOSIS — K580 Irritable bowel syndrome with diarrhea: Secondary | ICD-10-CM | POA: Diagnosis not present

## 2023-10-13 LAB — COMPREHENSIVE METABOLIC PANEL
ALT: 18 U/L (ref 0–35)
AST: 19 U/L (ref 0–37)
Albumin: 4.3 g/dL (ref 3.5–5.2)
Alkaline Phosphatase: 54 U/L (ref 39–117)
BUN: 22 mg/dL (ref 6–23)
CO2: 30 meq/L (ref 19–32)
Calcium: 9.4 mg/dL (ref 8.4–10.5)
Chloride: 105 meq/L (ref 96–112)
Creatinine, Ser: 0.97 mg/dL (ref 0.40–1.20)
GFR: 59.36 mL/min — ABNORMAL LOW (ref >60.00)
Glucose, Bld: 128 mg/dL — ABNORMAL HIGH (ref 70–99)
Potassium: 4.5 meq/L (ref 3.5–5.1)
Sodium: 140 meq/L (ref 135–145)
Total Bilirubin: 0.7 mg/dL (ref 0.2–1.2)
Total Protein: 6.4 g/dL (ref 6.0–8.3)

## 2023-10-13 LAB — LIPID PANEL
Cholesterol: 177 mg/dL (ref 0–200)
HDL: 84.8 mg/dL (ref >39.00)
LDL Cholesterol: 81 mg/dL (ref 0–99)
NonHDL: 92.31
Total CHOL/HDL Ratio: 2
Triglycerides: 57 mg/dL (ref 0.0–149.0)
VLDL: 11.4 mg/dL (ref 0.0–40.0)

## 2023-10-13 LAB — HEMOGLOBIN A1C: Hgb A1c MFr Bld: 6.3 % (ref 4.6–6.5)

## 2023-10-13 LAB — VITAMIN B12: Vitamin B-12: 609 pg/mL (ref 211–911)

## 2023-10-13 MED ORDER — ALENDRONATE SODIUM 70 MG PO TABS: 70.0000 mg | ORAL_TABLET | ORAL | 11 refills | Status: DC

## 2023-10-13 NOTE — Assessment & Plan Note (Signed)
Patient Counseling(The following topics were reviewed):  Preventative care handout given to pt  Health maintenance and immunizations reviewed. Please refer to Health maintenance section. Pt advised on safe sex, wearing seatbelts in car, and proper nutrition labwork ordered today for annual Dental health: Discussed importance of regular tooth brushing, flossing, and dental visits. Additional 15 minutes discussing IBS symptoms/conservative measures and also discussion of memory loss with review of previous visits 2020 with pcp

## 2023-10-13 NOTE — Assessment & Plan Note (Signed)
Improving without PPI  Diet controlled currently

## 2023-10-13 NOTE — Assessment & Plan Note (Signed)
Slight worsening  Original dx 2020 with stroke Referral placed for neurology (was seeing one in [place] however has since moved here for fall-spring and needs to establish locally

## 2023-10-13 NOTE — Assessment & Plan Note (Signed)
Pt non compliant to fosamax will restart.  Rx sent in for fosamax 70 mg  Instructed on proper administration Repeat bone density March 2025. Reviewed 2023 dexa

## 2023-10-13 NOTE — Assessment & Plan Note (Signed)
Continue otc b12 1000 mcg

## 2023-10-13 NOTE — Assessment & Plan Note (Signed)
Goal LDL < 70  Lipid panel ordered pending results continue Crestor work on low-cholesterol diet and exercise as tolerated

## 2023-10-13 NOTE — Patient Instructions (Addendum)
  I have sent an electronic order over to your preferred location for the following:   []   2D Mammogram  [x]   3D Mammogram  [x]   Bone Density   Please give this center a call to get scheduled at your convenience.  [x]   [facility]  [address] [place] [address]  [phone]  Make sure to wear two piece  clothing  No lotions powders or deodorants the day of the appointment Make sure to bring picture ID and insurance card.  Bring list of medications you are currently taking including any supplements.    ------------------------------------ A referral was placed today for neurology.  Please let us know if you have not heard back within 2 weeks about the referral.

## 2023-10-13 NOTE — Progress Notes (Signed)
Subjective:  Patient ID: Danielle George, female    DOB: September 15, 1951  Age: 72 y.o. MRN: [id number]  Patient Care Team: Mort Sawyers, FNP as PCP - General (Family Medicine)   CC:  Chief Complaint  Patient presents with   Annual Exam    HPI [name] is a 72 y.o. female who presents today for an annual physical exam. She reports consuming a general diet. The patient does not participate in regular exercise at present. She generally feels well. She reports sleeping fairly well. She does not have additional problems to discuss today.   Vision:Within last year Dental:Receives regular dental care    Mammogram: 03/20/23 Last pap: . 72 y/o  Colonoscopy: 04/07/22 Bone density scan: 01/12/22, osteoporosis in lower spine   Pt is with acute concerns.  Memory loss, pt thinks getting worse, husband states staying about the same maybe a small amount of worsening but not much per him. She only drives very rarely in [place] but does not drive locally when she is here. Never had a moment where she forgot where she was going and or ended up somewhere she didn't know how she got there. She does have some short term memory loss. Has been ever since stroke in 2020.   DM2: currently diet managed.   Microalbumin in urine, on losartan doing well.   HLD: on rosuvastatin 10 mg     Advanced Directives Patient does have advanced directives . She does not have a copy in the electronic medical record.   DEPRESSION SCREENING    10/13/2023    8:32 AM 11/12/2022   12:01 PM 10/20/2022   10:53 AM 08/31/2022    8:37 AM  PHQ 2/9 Scores  PHQ - 2 Score 0 0 0 0  PHQ- 9 Score 0        ROS: Negative unless specifically indicated above in HPI.    Current Outpatient Medications:    rosuvastatin (CRESTOR) 10 MG tablet, Take 10 mg by mouth daily., Disp: , Rfl:    alendronate (FOSAMAX) 70 MG tablet, Take 1 tablet (70 mg total) by mouth every 7 (seven) days. Take with a full glass of water on an  empty stomach., Disp: 4 tablet, Rfl: 11   aspirin EC 81 MG tablet, Take 81 mg by mouth daily. Swallow whole., Disp: , Rfl:    losartan (COZAAR) 25 MG tablet, TAKE 1 TABLET (25 MG TOTAL) BY MOUTH DAILY., Disp: 90 tablet, Rfl: 1   metoprolol succinate (TOPROL-XL) 25 MG 24 hr tablet, Take 25 mg by mouth daily., Disp: , Rfl:     Objective:    BP 122/86 (BP Location: Left Arm, Patient Position: Sitting, Cuff Size: Normal)   Pulse (!) 101   Temp 98.4 F (36.9 C) (Temporal)   Ht 5\' 4"  (1.626 m)   Wt 128 lb 12.8 oz (58.4 kg)   SpO2 100%   BMI 22.11 kg/m   BP Readings from Last 3 Encounters:  10/13/23 122/86  12/07/22 122/76  11/12/22 130/82      Physical Exam Constitutional:      General: She is not in acute distress.    Appearance: Normal appearance. She is normal weight. She is not ill-appearing.  HENT:     Head: Normocephalic.     Right Ear: Tympanic membrane normal.     Left Ear: Tympanic membrane normal.     Nose: Nose normal.     Mouth/Throat:     Mouth: Mucous membranes are moist.  Eyes:  Extraocular Movements: Extraocular movements intact.     Pupils: Pupils are equal, round, and reactive to light.  Cardiovascular:     Rate and Rhythm: Normal rate and regular rhythm.  Pulmonary:     Effort: Pulmonary effort is normal.     Breath sounds: Normal breath sounds.  Abdominal:     General: Abdomen is flat. Bowel sounds are normal.     Palpations: Abdomen is soft.     Tenderness: There is no guarding or rebound.  Musculoskeletal:        General: Normal range of motion.     Cervical back: Normal range of motion.  Skin:    General: Skin is warm.     Capillary Refill: Capillary refill takes less than 2 seconds.  Neurological:     General: No focal deficit present.     Mental Status: She is alert.  Psychiatric:        Mood and Affect: Mood normal.        Behavior: Behavior normal.        Thought Content: Thought content normal.        Judgment: Judgment normal.           Assessment & Plan:  Irritable bowel syndrome with diarrhea  Mixed hyperlipidemia Assessment & Plan: Goal LDL < 70  Lipid panel ordered pending results continue Crestor work on low-cholesterol diet and exercise as tolerated  Orders: -     Lipid panel  Vitamin B12 deficiency Assessment & Plan: Continue otc b12 1000 mcg   Orders: -     Vitamin B12  Essential hypertension Assessment & Plan: Continue metoprolol and losartan  monitor blood pressure periodically as needed goal less than 140/90  Orders: -     Comprehensive metabolic panel  Type 2 diabetes mellitus without complication, without long-term current use of insulin ([facility]) Assessment & Plan: Diet controlled Utd on eye exam  A1c ordered On losartan for urine microbumin  Foot exam in office today completed   Orders: -     Hemoglobin A1c  Localized osteoporosis without current pathological fracture Assessment & Plan: Pt non compliant to fosamax will restart.  Rx sent in for fosamax 70 mg  Instructed on proper administration Repeat bone density March 2025. Reviewed 2023 dexa  Orders: -     DG Bone Density; Future -     Alendronate Sodium; Take 1 tablet (70 mg total) by mouth every 7 (seven) days. Take with a full glass of water on an empty stomach.  Dispense: 4 tablet; Refill: 11  Screening mammogram for breast cancer -     3D Screening Mammogram, Left and Right; Future  Vascular dementia without behavioral disturbance ([facility]) Assessment & Plan: Slight worsening  Original dx 2020 with stroke Referral placed for neurology (was seeing one in [place] however has since moved here for fall-spring and needs to establish locally  Orders: -     Ambulatory referral to Neurology  Hx of cerebral infarction Assessment & Plan: Cont aspirin 81 mg daily  Orders: -     Ambulatory referral to Neurology  Short-term memory loss -     Ambulatory referral to Neurology  Encounter for general adult medical  examination without abnormal findings Assessment & Plan: Patient Counseling(The following topics were reviewed):  Preventative care handout given to pt  Health maintenance and immunizations reviewed. Please refer to Health maintenance section. Pt advised on safe sex, wearing seatbelts in car, and proper nutrition labwork ordered today for annual Dental health:  Discussed importance of regular tooth brushing, flossing, and dental visits. Additional 15 minutes discussing IBS symptoms/conservative measures and also discussion of memory loss with review of previous visits 2020 with pcp    Multiple nevi -     Ambulatory referral to Dermatology  Pelvic joint pain, right Assessment & Plan: Chronic.    Gastroesophageal reflux disease without esophagitis Assessment & Plan: Improving without PPI  Diet controlled currently        Follow-up: Return in about 6 months (around 04/12/2024) for f/u diabetes.   Mort Sawyers, FNP

## 2023-10-13 NOTE — Assessment & Plan Note (Signed)
Chronic. 

## 2023-10-13 NOTE — Assessment & Plan Note (Signed)
Cont aspirin 81 mg daily

## 2023-10-13 NOTE — Assessment & Plan Note (Signed)
Continue metoprolol and losartan  monitor blood pressure periodically as needed goal less than 140/90

## 2023-10-13 NOTE — Assessment & Plan Note (Signed)
Diet controlled Utd on eye exam  A1c ordered On losartan for urine microbumin  Foot exam in office today completed

## 2023-10-15 ENCOUNTER — Ambulatory Visit (INDEPENDENT_AMBULATORY_CARE_PROVIDER_SITE_OTHER): Payer: Medicare PPO

## 2023-10-15 VITALS — Ht 64.0 in | Wt 128.0 lb

## 2023-10-15 DIAGNOSIS — Z0000 Encounter for general adult medical examination without abnormal findings: Secondary | ICD-10-CM | POA: Diagnosis not present

## 2023-10-15 NOTE — Patient Instructions (Signed)
Danielle George , Thank you for taking time to come for your Medicare Wellness Visit. I appreciate your ongoing commitment to your health goals. Please review the following plan we discussed and let me know if I can assist you in the future.   Referrals/Orders/Follow-Ups/Clinician Recommendations: none  This is a list of the screening recommended for you and due dates:  Health Maintenance  Topic Date Due   Complete foot exam   Never done   COVID-19 Vaccine (3 - 2024-25 season) 07/04/2023   Eye exam for diabetics  08/03/2023   Yearly kidney health urinalysis for diabetes  11/13/2023   Hepatitis C Screening  10/21/2023*   Mammogram  03/19/2024   Hemoglobin A1C  04/12/2024   Yearly kidney function blood test for diabetes  10/12/2024   Medicare Annual Wellness Visit  10/14/2024   Colon Cancer Screening  04/07/2032   Pneumonia Vaccine  Completed   Flu Shot  Completed   DEXA scan (bone density measurement)  Completed   Zoster (Shingles) Vaccine  Completed   HPV Vaccine  Aged Out   DTaP/Tdap/Td vaccine  Discontinued  *Topic was postponed. The date shown is not the original due date.    Advanced directives: (Copy Requested) Please bring a copy of your health care power of attorney and living will to the office to be added to your chart at your convenience.  Next Medicare Annual Wellness Visit scheduled for next year: Yes 10/18/24 @ 10:10am telephone

## 2023-10-15 NOTE — Progress Notes (Signed)
Subjective:   Danielle George is a 72 y.o. female who presents for Medicare Annual (Subsequent) preventive examination.  Visit Complete: Virtual I connected with  [facility] on 10/15/23 by a audio enabled telemedicine application and verified that I am speaking with the correct person using two identifiers.  Patient Location: Home  Provider Location: Home Office  I discussed the limitations of evaluation and management by telemedicine. The patient expressed understanding and agreed to proceed.  Vital Signs: Because this visit was a virtual/telehealth visit, some criteria may be missing or patient reported. Any vitals not documented were not able to be obtained and vitals that have been documented are patient reported.  Patient Medicare AWV questionnaire was completed by the patient on (not done); I have confirmed that all information answered by patient is correct and no changes since this date.  Cardiac Risk Factors include: advanced age (>33men, >25 women);diabetes mellitus;dyslipidemia;hypertension     Objective:    Today's Vitals   10/15/23 1144  Weight: 128 lb (58.1 kg)  Height: 5\' 4"  (1.626 m)   Body mass index is 21.97 kg/m.     10/15/2023   11:56 AM 10/20/2022   10:48 AM 02/06/2022    6:25 PM  Advanced Directives  Does Patient Have a Medical Advance Directive? Yes No No  Type of Estate agent of [place];Living will    Copy of Healthcare Power of Attorney in Chart? No - copy requested    Would patient like information on creating a medical advance directive?  No - Patient declined No - Patient declined    Current Medications (verified) Outpatient Encounter Medications as of 10/15/2023  Medication Sig   alendronate (FOSAMAX) 70 MG tablet Take 1 tablet (70 mg total) by mouth every 7 (seven) days. Take with a full glass of water on an empty stomach.   aspirin EC 81 MG tablet Take 81 mg by mouth daily. Swallow whole.   losartan (COZAAR)  25 MG tablet TAKE 1 TABLET (25 MG TOTAL) BY MOUTH DAILY.   metoprolol succinate (TOPROL-XL) 25 MG 24 hr tablet Take 25 mg by mouth daily.   rosuvastatin (CRESTOR) 10 MG tablet Take 10 mg by mouth daily.   No facility-administered encounter medications on file as of 10/15/2023.    Allergies (verified) Metformin and related and Penicillins   History: No past medical history on file. Past Surgical History:  Procedure Laterality Date   CHOLECYSTECTOMY     Family History  Problem Relation Age of Onset   Osteoporosis Mother    Atrial fibrillation Mother    Stroke Father        x3   Dementia Father    Diabetes Father    Diabetes Sister    Stroke Brother    Diabetes Paternal Grandmother    Stroke Cousin 44   Social History   Socioeconomic History   Marital status: Married    Spouse name: Not on file   Number of children: Not on file   Years of education: Not on file   Highest education level: 12th grade  Occupational History   Not on file  Tobacco Use   Smoking status: Never   Smokeless tobacco: Never  Vaping Use   Vaping status: Never Used  Substance and Sexual Activity   Alcohol use: Not Currently   Drug use: Never   Sexual activity: Yes    Partners: Male  Other Topics Concern   Not on file  Social History Narrative  Not on file   Social Drivers of Health   Financial Resource Strain: Low Risk  (10/15/2023)   Overall Financial Resource Strain (CARDIA)    Difficulty of Paying Living Expenses: Not hard at all  Food Insecurity: No Food Insecurity (10/15/2023)   Hunger Vital Sign    Worried About Running Out of Food in the Last Year: Never true    Ran Out of Food in the Last Year: Never true  Transportation Needs: No Transportation Needs (10/15/2023)   PRAPARE - Administrator, Civil Service (Medical): No    Lack of Transportation (Non-Medical): No  Physical Activity: Insufficiently Active (10/15/2023)   Exercise Vital Sign    Days of Exercise per  Week: 2 days    Minutes of Exercise per Session: 30 min  Stress: No Stress Concern Present (10/15/2023)   [employer] of Occupational Health - Occupational Stress Questionnaire    Feeling of Stress : Not at all  Social Connections: Moderately Isolated (10/15/2023)   Social Connection and Isolation Panel [NHANES]    Frequency of Communication with Friends and Family: Three times a week    Frequency of Social Gatherings with Friends and Family: Twice a week    Attends Religious Services: Never    Database administrator or Organizations: No    Attends Engineer, structural: Never    Marital Status: Married    Tobacco Counseling Counseling given: Not Answered  Clinical Intake:  Pre-visit preparation completed: No  Pain : No/denies pain   BMI - recorded: 21.97 Nutritional Status: BMI of 19-24  Normal Nutritional Risks: None Diabetes: Yes CBG done?: No Did pt. bring in CBG monitor from home?: No  How often do you need to have someone help you when you read instructions, pamphlets, or other written materials from your doctor or pharmacy?: 1 - Never  Interpreter Needed?: No  Comments: lives with husband Information entered by :: B.Evelin Cake,LPN   Activities of Daily Living    10/15/2023   11:56 AM 10/20/2022   10:50 AM  In your present state of health, do you have any difficulty performing the following activities:  Hearing? 0 0  Vision? 1 0  Difficulty concentrating or making decisions? 1 0  Walking or climbing stairs? 0 0  Dressing or bathing? 0 0  Doing errands, shopping? 0 0  Preparing Food and eating ? N N  Using the Toilet? N N  In the past six months, have you accidently leaked urine? N N  Do you have problems with loss of bowel control? N N  Managing your Medications? N N  Managing your Finances? N N  Housekeeping or managing your Housekeeping? N N    Patient Care Team: Mort Sawyers, FNP as PCP - General (Family Medicine)  Indicate any  recent Medical Services you may have received from other than [facility] providers in the past year (date may be approximate).     Assessment:   This is a routine wellness examination for Danielle George.  Hearing/Vision screen Hearing Screening - Comments:: Pt says no problems with her hearing Vision Screening - Comments:: Pt wears glasses;vision good with them;decrease in peripheral vision kin rt eye Dr Jackie Plum   Goals Addressed             This Visit's Progress    COMPLETED: Maintain healthy lifestyle   On track    Stay active Healthy lifestyle       Depression Screen    10/15/2023  11:52 AM 10/13/2023    8:32 AM 11/12/2022   12:01 PM 10/20/2022   10:53 AM 08/31/2022    8:37 AM  PHQ 2/9 Scores  PHQ - 2 Score 0 0 0 0 0  PHQ- 9 Score  0       Fall Risk    10/15/2023   11:48 AM 10/13/2023    8:32 AM 11/12/2022   12:01 PM 10/20/2022   10:52 AM  Fall Risk   Falls in the past year? 0 0 0 0  Number falls in past yr: 0 0  0  Injury with Fall? 0 0  0  Risk for fall due to : No Fall Risks No Fall Risks  No Fall Risks  Follow up Falls prevention discussed;Education provided Falls evaluation completed  Falls evaluation completed;Falls prevention discussed    MEDICARE RISK AT HOME: Medicare Risk at Home Any stairs in or around the home?: Yes If so, are there any without handrails?: Yes Home free of loose throw rugs in walkways, pet beds, electrical cords, etc?: Yes Adequate lighting in your home to reduce risk of falls?: Yes Life alert?: No Use of a cane, walker or w/c?: No Grab bars in the bathroom?: No Shower chair or bench in shower?: No Elevated toilet seat or a handicapped toilet?: No  TIMED UP AND GO:  Was the test performed?  No    Cognitive Function:        10/15/2023   12:02 PM 10/20/2022   10:57 AM  6CIT Screen  What Year? 0 points 0 points  What month? 0 points 0 points  What time? 0 points 0 points  Count back from 20 0 points 0 points   Months in reverse 0 points 2 points  Repeat phrase 6 points 10 points  Total Score 6 points 12 points    Immunizations Immunization History  Administered Date(s) Administered   Fluad Quad(high Dose 65+) 08/31/2022   Influenza, High Dose Seasonal PF 08/02/2019, 08/08/2020   Influenza-Unspecified 08/10/2016, 08/25/2023   PFIZER(Purple Top)SARS-COV-2 Vaccination 12/10/2019, 01/04/2020   Pneumococcal Conjugate-13 07/06/2019   Pneumococcal Polysaccharide-23 07/31/2020   Zoster Recombinant(Shingrix) 07/17/2020, 05/29/2021   Zoster, Live 08/10/2016    TDAP status: Up to date  Flu Vaccine status: Up to date  Pneumococcal vaccine status: Up to date  Covid-19 vaccine status: Completed vaccines  Qualifies for Shingles Vaccine? Yes   Zostavax completed Yes   Shingrix Completed?: Yes  Screening Tests Health Maintenance  Topic Date Due   Diabetic kidney evaluation - Urine ACR  11/13/2023   OPHTHALMOLOGY EXAM  10/15/2023 (Originally 08/03/2023)   Hepatitis C Screening  10/21/2023 (Originally 08/21/1969)   COVID-19 Vaccine (3 - 2024-25 season) 10/31/2023 (Originally 07/04/2023)   MAMMOGRAM  03/19/2024   HEMOGLOBIN A1C  04/12/2024   Diabetic kidney evaluation - eGFR measurement  10/12/2024   FOOT EXAM  10/12/2024   Medicare Annual Wellness (AWV)  10/14/2024   Colonoscopy  04/07/2032   Pneumonia Vaccine 23+ Years old  Completed   INFLUENZA VACCINE  Completed   DEXA SCAN  Completed   Zoster Vaccines- Shingrix  Completed   HPV VACCINES  Aged Out   DTaP/Tdap/Td  Discontinued    Health Maintenance  Health Maintenance Due  Topic Date Due   Diabetic kidney evaluation - Urine ACR  11/13/2023    Colorectal cancer screening: Type of screening: Colonoscopy. Completed 04/07/2022. Repeat every 10 years  Mammogram status: Completed 10/13/2023. Repeat every yearorder placed by MD-pt will schedule  Bone Density  status: Completed 01/12/2022. Results reflect: Bone density results: NORMAL.  Repeat every 5 years.  Lung Cancer Screening: (Low Dose CT Chest recommended if Age 41-80 years, 20 pack-year currently smoking OR have quit w/in 15years.) does not qualify.   Lung Cancer Screening Referral: no  Additional Screening:  Hepatitis C Screening: does not qualify; Completed no  Vision Screening: Recommended annual ophthalmology exams for early detection of glaucoma and other disorders of the eye. Is the patient up to date with their annual eye exam?  Yes  Who is the provider or what is the name of the office in which the patient attends annual eye exams? Dr Corey Harold If pt is not established with a provider, would they like to be referred to a provider to establish care? No .   Dental Screening: Recommended annual dental exams for proper oral hygiene  Diabetic Foot Exam: Diabetic Foot Exam: Completed 10/13/23  Community Resource Referral / Chronic Care Management: CRR required this visit?  No   CCM required this visit?  No   Plan:     I have personally reviewed and noted the following in the patient's chart:   Medical and social history Use of alcohol, tobacco or illicit drugs  Current medications and supplements including opioid prescriptions. Patient is not currently taking opioid prescriptions. Functional ability and status Nutritional status Physical activity Advanced directives List of other physicians Hospitalizations, surgeries, and ER visits in previous 12 months Vitals Screenings to include cognitive, depression, and falls Referrals and appointments  In addition, I have reviewed and discussed with patient certain preventive protocols, quality metrics, and best practice recommendations. A written personalized care plan for preventive services as well as general preventive health recommendations were provided to patient.    Sue Lush, LPN   [id number]/08/[id number]   After Visit Summary: (MyChart) Due to this being a telephonic visit, the after visit  summary with patients personalized plan was offered to patient via MyChart   Nurse Notes: The patient states they are doing well and has no concerns or questions at this time.

## 2023-10-21 ENCOUNTER — Other Ambulatory Visit: Payer: Self-pay | Admitting: Family

## 2023-10-25 MED ORDER — METOPROLOL SUCCINATE ER 25 MG PO TB24
25.0000 mg | ORAL_TABLET | Freq: Every day | ORAL | 3 refills | Status: DC
  Filled 2023-10-25: qty 90, 90d supply, fill #0
  Filled 2024-01-21 (×3): qty 90, 90d supply, fill #1
  Filled 2024-04-25: qty 90, 90d supply, fill #2

## 2023-10-26 ENCOUNTER — Other Ambulatory Visit: Payer: Self-pay

## 2023-10-26 ENCOUNTER — Other Ambulatory Visit (HOSPITAL_COMMUNITY): Payer: Self-pay

## 2023-10-28 ENCOUNTER — Other Ambulatory Visit (HOSPITAL_COMMUNITY): Payer: Self-pay

## 2023-11-08 DIAGNOSIS — L570 Actinic keratosis: Secondary | ICD-10-CM | POA: Diagnosis not present

## 2023-11-08 DIAGNOSIS — L438 Other lichen planus: Secondary | ICD-10-CM | POA: Diagnosis not present

## 2023-11-08 DIAGNOSIS — D2272 Melanocytic nevi of left lower limb, including hip: Secondary | ICD-10-CM | POA: Diagnosis not present

## 2023-11-08 DIAGNOSIS — D2261 Melanocytic nevi of right upper limb, including shoulder: Secondary | ICD-10-CM | POA: Diagnosis not present

## 2023-11-08 DIAGNOSIS — L821 Other seborrheic keratosis: Secondary | ICD-10-CM | POA: Diagnosis not present

## 2023-11-08 DIAGNOSIS — D225 Melanocytic nevi of trunk: Secondary | ICD-10-CM | POA: Diagnosis not present

## 2023-11-08 DIAGNOSIS — L814 Other melanin hyperpigmentation: Secondary | ICD-10-CM | POA: Diagnosis not present

## 2023-11-13 ENCOUNTER — Other Ambulatory Visit: Payer: Self-pay | Admitting: Family

## 2023-11-13 DIAGNOSIS — R809 Proteinuria, unspecified: Secondary | ICD-10-CM

## 2024-01-19 ENCOUNTER — Telehealth: Payer: Self-pay | Admitting: Anesthesiology

## 2024-01-19 ENCOUNTER — Encounter: Payer: Self-pay | Admitting: Neurology

## 2024-01-19 ENCOUNTER — Telehealth: Payer: Self-pay | Admitting: Neurology

## 2024-01-19 ENCOUNTER — Ambulatory Visit: Payer: Medicare PPO | Admitting: Neurology

## 2024-01-19 VITALS — BP 128/88 | HR 90 | Ht 64.0 in | Wt 132.0 lb

## 2024-01-19 DIAGNOSIS — G3184 Mild cognitive impairment, so stated: Secondary | ICD-10-CM

## 2024-01-19 NOTE — Telephone Encounter (Signed)
 Marland Kitchen

## 2024-01-19 NOTE — Telephone Encounter (Signed)
Neuropsych referral faxed to [facility] (fax#[phone], phone# [phone])

## 2024-01-19 NOTE — Patient Instructions (Signed)
 Continue current medications Will ATN profile to look for Alzheimer disease biomarkers  MRI brain without contrast Referral for formal neuropsychological evaluation Continue with exercise Follow-up in 1 year or sooner if worse.   There are well-accepted and sensible ways to reduce risk for Alzheimers disease and other degenerative brain disorders .  Exercise Daily Walk A daily 20 minute walk should be part of your routine. Disease related apathy can be a significant roadblock to exercise and the only way to overcome this is to make it a daily routine and perhaps have a reward at the end (something your loved one loves to eat or drink perhaps) or a personal trainer coming to the home can also be very useful. Most importantly, the patient is much more likely to exercise if the caregiver / spouse does it with him/her. In general a structured, repetitive schedule is best.  General Health: Any diseases which effect your body will effect your brain such as a pneumonia, urinary infection, blood clot, heart attack or stroke. Keep contact with your primary care doctor for regular follow ups.  Sleep. A good nights sleep is healthy for the brain. Seven hours is recommended. If you have insomnia or poor sleep habits we can give you some instructions. If you have sleep apnea wear your mask.  Diet: Eating a heart healthy diet is also a good idea; fish and poultry instead of red meat, nuts (mostly non-peanuts), vegetables, fruits, olive oil or canola oil (instead of butter), minimal salt (use other spices to flavor foods), whole grain rice, bread, cereal and pasta and wine in moderation.Research is now showing that the MIND diet, which is a combination of The Mediterranean diet and the DASH diet, is beneficial for cognitive processing and longevity. Information about this diet can be found in The MIND Diet, a book by Alonna Minium, MS, RDN, and online at [employer]  Finances,  Power of [address] and Advance Directives: You should consider putting legal safeguards in place with regard to financial and medical decision making. While the spouse always has power of attorney for medical and financial issues in the absence of any form, you should consider what you want in case the spouse / caregiver is no longer around or capable of making decisions.

## 2024-01-19 NOTE — Progress Notes (Signed)
 [facility]  PATIENT: Danielle George DOB: [birth year]  REQUESTING CLINICIAN: Mort Sawyers, FNP HISTORY FROM: Patient/Husband  REASON FOR VISIT: Cognitive Impairment    HISTORICAL  CHIEF COMPLAINT:  Chief Complaint  Patient presents with   New Patient (Initial Visit)    Pt in 13, here with husband Linde Gillis  Pt is referred for h/o CVA and vascular dementia.      HISTORY OF PRESENT ILLNESS:  This is a 73 year old woman past medical history hypertension, hyperlipidemia, stroke 6 years ago, cognitive impairment who is presenting for management of her mild cognitive impairment.  Husband tells me patient suffered a stroke 6 years ago with deficit of left upper quadrant vision field loss and cognitive impairment.  Since the stroke, she started suffering with cognitive impairment described as being forgetful, asking the same questions, sometimes having word finding difficulty.  Patient tells me she feels like her memory is getting worse but husband feels like she is stable.  There is also reports of frustration and increased agitation when her routine activity is disrupted when family members are visiting, when she is taking care of her grandchild.  Other than that, she is independent in all activities of daily living.   TBI:  No past history of TBI Stroke: Yes no past history of stroke Seizures:  no past history of seizures Sleep:   no history of sleep apnea.  Mood:  patient denies anxiety and depression Family history of Dementia: Father with dementia   Functional status: independent in all ADLs and IADLs Patient lives with husband. Cooking: yes, but has born the pots  Cleaning: yes Shopping: yes with husband Bathing: no issues Toileting: no issues Driving: No  Bills: no issues Medications: no issues  Ever left the stove on by accident?: Yes  Forget how to use items around the house?: no  Getting lost going to familiar places?: denies  Forgetting loved  ones names?: Denies  Word finding difficulty? Denies  Sleep: Good    OTHER MEDICAL CONDITIONS: Hypertension, Hyperlipidemia    REVIEW OF SYSTEMS: Full 14 system review of systems performed and negative with exception of: As noted in the HPI   ALLERGIES: Allergies  Allergen Reactions   Metformin And Related Other (See Comments)    Gi upset, losing weight   Penicillins     HOME MEDICATIONS: Outpatient Medications Prior to Visit  Medication Sig Dispense Refill   alendronate (FOSAMAX) 70 MG tablet Take 1 tablet (70 mg total) by mouth every 7 (seven) days. Take with a full glass of water on an empty stomach. 4 tablet 11   aspirin EC 81 MG tablet Take 81 mg by mouth daily. Swallow whole.     losartan (COZAAR) 25 MG tablet TAKE 1 TABLET (25 MG TOTAL) BY MOUTH DAILY. 90 tablet 1   metoprolol succinate (TOPROL-XL) 25 MG 24 hr tablet Take 1 tablet (25 mg total) by mouth daily. 90 tablet 3   rosuvastatin (CRESTOR) 10 MG tablet Take 10 mg by mouth daily.     No facility-administered medications prior to visit.    PAST MEDICAL HISTORY: Past Medical History:  Diagnosis Date   Elevated cholesterol    Hypertension    Osteoporosis     PAST SURGICAL HISTORY: Past Surgical History:  Procedure Laterality Date   CHOLECYSTECTOMY      FAMILY HISTORY: Family History  Problem Relation Age of Onset   Osteoporosis Mother    Atrial fibrillation Mother    Stroke Father  x3   Dementia Father    Diabetes Father    Diabetes Sister    Stroke Brother    Diabetes Paternal Grandmother    Stroke Cousin 54    SOCIAL HISTORY: Social History   Socioeconomic History   Marital status: Married    Spouse name: Not on file   Number of children: Not on file   Years of education: Not on file   Highest education level: 12th grade  Occupational History   Not on file  Tobacco Use   Smoking status: Never   Smokeless tobacco: Never  Vaping Use   Vaping status: Never Used  Substance and  Sexual Activity   Alcohol use: Not Currently   Drug use: Never   Sexual activity: Yes    Partners: Male  Other Topics Concern   Not on file  Social History Narrative   Not on file   Social Drivers of Health   Financial Resource Strain: Low Risk  (10/15/2023)   Overall Financial Resource Strain (CARDIA)    Difficulty of Paying Living Expenses: Not hard at all  Food Insecurity: No Food Insecurity (10/15/2023)   Hunger Vital Sign    Worried About Running Out of Food in the Last Year: Never true    Ran Out of Food in the Last Year: Never true  Transportation Needs: No Transportation Needs (10/15/2023)   PRAPARE - Administrator, Civil Service (Medical): No    Lack of Transportation (Non-Medical): No  Physical Activity: Insufficiently Active (10/15/2023)   Exercise Vital Sign    Days of Exercise per Week: 2 days    Minutes of Exercise per Session: 30 min  Stress: No Stress Concern Present (10/15/2023)   [employer] of Occupational Health - Occupational Stress Questionnaire    Feeling of Stress : Not at all  Social Connections: Moderately Isolated (10/15/2023)   Social Connection and Isolation Panel [NHANES]    Frequency of Communication with Friends and Family: Three times a week    Frequency of Social Gatherings with Friends and Family: Twice a week    Attends Religious Services: Never    Database administrator or Organizations: No    Attends Banker Meetings: Never    Marital Status: Married  Catering manager Violence: Not At Risk (10/15/2023)   Humiliation, Afraid, Rape, and Kick questionnaire    Fear of Current or Ex-Partner: No    Emotionally Abused: No    Physically Abused: No    Sexually Abused: No    PHYSICAL EXAM  GENERAL EXAM/CONSTITUTIONAL: Vitals:  Vitals:   01/19/24 1035  BP: 128/88  Pulse: 90  Weight: 132 lb (59.9 kg)  Height: 5\' 4"  (1.626 m)   Body mass index is 22.66 kg/m. Wt Readings from Last 3 Encounters:   01/19/24 132 lb (59.9 kg)  10/15/23 128 lb (58.1 kg)  10/13/23 128 lb 12.8 oz (58.4 kg)   Patient is in no distress; well developed, nourished and groomed; neck is supple  MUSCULOSKELETAL: Gait, strength, tone, movements noted in Neurologic exam below  NEUROLOGIC: MENTAL STATUS:     01/19/2024   10:38 AM  MMSE - Mini Mental State Exam  Orientation to time 1  Orientation to Place 4  Registration 3  Attention/ Calculation 5  Recall 2  Language- name 2 objects 2  Language- repeat 1  Language- follow 3 step command 3  Language- read & follow direction 1  Write a sentence 1  Copy design 1  Total score 24   awake, alert, oriented to person, place and time recent and remote memory intact normal attention and concentration language fluent, comprehension intact, naming intact fund of knowledge appropriate  CRANIAL NERVE:  2nd, 3rd, 4th, 6th- visual fields full to confrontation, extraocular muscles intact, no nystagmus 5th - facial sensation symmetric 7th - facial strength symmetric 8th - hearing intact 9th - palate elevates symmetrically, uvula midline 11th - shoulder shrug symmetric 12th - tongue protrusion midline  MOTOR:  normal bulk and tone, full strength in the BUE, BLE  SENSORY:  normal and symmetric to light touch  COORDINATION:  finger-nose-finger, fine finger movements normal  GAIT/STATION:  normal    DIAGNOSTIC DATA (LABS, IMAGING, TESTING) - I reviewed patient records, labs, notes, testing and imaging myself where available.  Lab Results  Component Value Date   WBC 7.6 08/31/2022   HGB 13.2 08/31/2022   HCT 39.2 08/31/2022   MCV 93.5 08/31/2022   PLT 158.0 08/31/2022      Component Value Date/Time   NA 140 10/13/2023 0932   K 4.5 10/13/2023 0932   CL 105 10/13/2023 0932   CO2 30 10/13/2023 0932   GLUCOSE 128 (H) 10/13/2023 0932   BUN 22 10/13/2023 0932   CREATININE 0.97 10/13/2023 0932   CALCIUM 9.4 10/13/2023 0932   PROT 6.4  10/13/2023 0932   ALBUMIN 4.3 10/13/2023 0932   AST 19 10/13/2023 0932   ALT 18 10/13/2023 0932   ALKPHOS 54 10/13/2023 0932   BILITOT 0.7 10/13/2023 0932   GFRNONAA 53 (L) 02/06/2022 1900   Lab Results  Component Value Date   CHOL 177 10/13/2023   HDL 84.80 10/13/2023   LDLCALC 81 10/13/2023   TRIG 57.0 10/13/2023   CHOLHDL 2 10/13/2023   Lab Results  Component Value Date   HGBA1C 6.3 10/13/2023   Lab Results  Component Value Date   VITAMINB12 609 10/13/2023   Lab Results  Component Value Date   TSH 1.32 11/12/2022      ASSESSMENT AND PLAN  73 y.o. year old female with hypertension, hyperlipidemia, stroke 6 years ago, mild cognitive impairment who is presenting for evaluation and management of her mild cognitive impairment described as forgetfulness, word finding difficulty.  On exam, she scored 24 out of 30 on MMSE indicative of impairment.  Plan will be to obtain a brain MRI, refer the patient for formal neuropsychological testing and obtain ATN profile to look for Alzheimer disease biomarkers.  Hr recent labs showed a normal HgA1c, TSH and B12 level.  I will contact the patient to go over the result.  I will see her in 1 year or sooner if worse.   1. Mild cognitive impairment      Patient Instructions  Continue current medications Will ATN profile to look for Alzheimer disease biomarkers  MRI brain without contrast Referral for formal neuropsychological evaluation Continue with exercise Follow-up in 1 year or sooner if worse.   There are well-accepted and sensible ways to reduce risk for Alzheimers disease and other degenerative brain disorders .  Exercise Daily Walk A daily 20 minute walk should be part of your routine. Disease related apathy can be a significant roadblock to exercise and the only way to overcome this is to make it a daily routine and perhaps have a reward at the end (something your loved one loves to eat or drink perhaps) or a personal trainer  coming to the home can also be very useful. Most importantly, the patient  is much more likely to exercise if the caregiver / spouse does it with him/her. In general a structured, repetitive schedule is best.  General Health: Any diseases which effect your body will effect your brain such as a pneumonia, urinary infection, blood clot, heart attack or stroke. Keep contact with your primary care doctor for regular follow ups.  Sleep. A good nights sleep is healthy for the brain. Seven hours is recommended. If you have insomnia or poor sleep habits we can give you some instructions. If you have sleep apnea wear your mask.  Diet: Eating a heart healthy diet is also a good idea; fish and poultry instead of red meat, nuts (mostly non-peanuts), vegetables, fruits, olive oil or canola oil (instead of butter), minimal salt (use other spices to flavor foods), whole grain rice, bread, cereal and pasta and wine in moderation.Research is now showing that the MIND diet, which is a combination of The Mediterranean diet and the DASH diet, is beneficial for cognitive processing and longevity. Information about this diet can be found in The MIND Diet, a book by Alonna Minium, MS, RDN, and online at [employer]  Finances, Power of [address] and Advance Directives: You should consider putting legal safeguards in place with regard to financial and medical decision making. While the spouse always has power of attorney for medical and financial issues in the absence of any form, you should consider what you want in case the spouse / caregiver is no longer around or capable of making decisions.   Orders Placed This Encounter  Procedures   MR BRAIN WO CONTRAST   ATN PROFILE   Ambulatory referral to Neuropsychology    No orders of the defined types were placed in this encounter.   Return in about 1 year (around 01/18/2025).    Windell Norfolk, MD 01/19/2024, 12:55 PM  [facility] [address], [address] [place], [place] [address] [phone]

## 2024-01-21 ENCOUNTER — Other Ambulatory Visit (HOSPITAL_COMMUNITY): Payer: Self-pay

## 2024-01-21 ENCOUNTER — Other Ambulatory Visit: Payer: Self-pay

## 2024-01-21 ENCOUNTER — Other Ambulatory Visit: Payer: Self-pay | Admitting: Family

## 2024-01-21 NOTE — Telephone Encounter (Signed)
 Copied from CRM [id number]. Topic: Clinical - Medication Refill >> Jan 21, 2024 10:54 AM Elizebeth Brooking wrote: Most Recent Primary Care Visit:  Provider: Sue Lush  Department: [facility]  Visit Type: MEDICARE AWV, SEQUENTIAL  Date: 10/15/2023  Medication: rosuvastatin (CRESTOR) 10 MG tablet  Has the patient contacted their pharmacy? Yes (Agent: If no, request that the patient contact the pharmacy for the refill. If patient does not wish to contact the pharmacy document the reason why and proceed with request.) (Agent: If yes, when and what did the pharmacy advise?)  Is this the correct pharmacy for this prescription? Yes If no, delete pharmacy and type the correct one.  This is the patient's preferred pharmacy:  [facility] [phone] [facility] [place] - [address] [place] [address] Phone: [phone] Fax: [phone]     Has the prescription been filled recently? No  Is the patient out of the medication? Yes  Has the patient been seen for an appointment in the last year OR does the patient have an upcoming appointment? Yes  Can we respond through MyChart? Yes  Agent: Please be advised that Rx refills may take up to 3 business days. We ask that you follow-up with your pharmacy.

## 2024-01-22 ENCOUNTER — Telehealth: Payer: Self-pay | Admitting: Neurology

## 2024-01-22 LAB — ATN PROFILE
A -- Beta-amyloid 42/40 Ratio: 0.095 — ABNORMAL LOW (ref >0.102)
Beta-amyloid 40: 210.59 pg/mL
Beta-amyloid 42: 20.05 pg/mL
N -- NfL, Plasma: 2.9 pg/mL (ref 0.00–7.64)
T -- p-tau181: 1.59 pg/mL — ABNORMAL HIGH (ref 0.00–0.97)

## 2024-01-22 NOTE — Telephone Encounter (Signed)
 Danielle George: [id number] exp. 01/22/24-03/22/24 sent to GI [phone]

## 2024-01-24 ENCOUNTER — Other Ambulatory Visit: Payer: Self-pay

## 2024-01-24 ENCOUNTER — Encounter: Payer: Self-pay | Admitting: Neurology

## 2024-01-25 ENCOUNTER — Encounter: Payer: Self-pay | Admitting: Family

## 2024-01-25 MED ORDER — ROSUVASTATIN CALCIUM 10 MG PO TABS: 10.0000 mg | ORAL_TABLET | Freq: Every day | ORAL | 3 refills | Status: DC

## 2024-01-28 ENCOUNTER — Telehealth: Admitting: Neurology

## 2024-01-28 ENCOUNTER — Encounter: Payer: Self-pay | Admitting: Neurology

## 2024-01-28 DIAGNOSIS — G3184 Mild cognitive impairment, so stated: Secondary | ICD-10-CM | POA: Diagnosis not present

## 2024-01-28 DIAGNOSIS — G309 Alzheimer's disease, unspecified: Secondary | ICD-10-CM

## 2024-01-28 NOTE — Patient Instructions (Signed)
 Continue current medications Will obtain a PET amyloid to confirm the presence of amyloid plaque I will contact you to go over the results and discuss further therapy option, Kisunla versus Leqembi.

## 2024-01-28 NOTE — Progress Notes (Signed)
 [facility]  PATIENT: Danielle George DOB: 1951-09-05  REQUESTING CLINICIAN: Mort Sawyers, FNP HISTORY FROM: Patient/Husband  REASON FOR VISIT: Cognitive Impairment follow up   HISTORICAL  CHIEF COMPLAINT:  Chief Complaint  Patient presents with   Memory Loss    Follow up. To discuss treatment.    INTERVAL HISTORY 01/28/2024 Patient was called today for video visit.  At last visit in March 19, we obtained ATN profile which showed presence of Alzheimer disease biomarker.  Patient mild cognitive impairment is likely due to Alzheimer disease.  We discussed treatment plan and she is interested in the new monoclonal antibodies.  Will proceed with PET amyloid.  HISTORY OF PRESENT ILLNESS:  This is a 73 year old woman past medical history hypertension, hyperlipidemia, stroke 6 years ago, cognitive impairment who is presenting for management of her mild cognitive impairment.  Husband tells me patient suffered a stroke 6 years ago with deficit of left upper quadrant vision field loss and cognitive impairment.  Since the stroke, she started suffering with cognitive impairment described as being forgetful, asking the same questions, sometimes having word finding difficulty.  Patient tells me she feels like her memory is getting worse but husband feels like she is stable.  There is also reports of frustration and increased agitation when her routine activity is disrupted when family members are visiting, when she is taking care of her grandchild.  Other than that, she is independent in all activities of daily living.   TBI:  No past history of TBI Stroke: Yes no past history of stroke Seizures:  no past history of seizures Sleep:   no history of sleep apnea.  Mood:  patient denies anxiety and depression Family history of Dementia: Father with dementia   Functional status: independent in all ADLs and IADLs Patient lives with husband. Cooking: yes, but has born the pots   Cleaning: yes Shopping: yes with husband Bathing: no issues Toileting: no issues Driving: No  Bills: no issues Medications: no issues  Ever left the stove on by accident?: Yes  Forget how to use items around the house?: no  Getting lost going to familiar places?: denies  Forgetting loved ones names?: Denies  Word finding difficulty? Denies  Sleep: Good    OTHER MEDICAL CONDITIONS: Hypertension, Hyperlipidemia    REVIEW OF SYSTEMS: Full 14 system review of systems performed and negative with exception of: As noted in the HPI   ALLERGIES: Allergies  Allergen Reactions   Metformin And Related Other (See Comments)    Gi upset, losing weight   Penicillins     HOME MEDICATIONS: Outpatient Medications Prior to Visit  Medication Sig Dispense Refill   alendronate (FOSAMAX) 70 MG tablet Take 1 tablet (70 mg total) by mouth every 7 (seven) days. Take with a full glass of water on an empty stomach. 4 tablet 11   aspirin EC 81 MG tablet Take 81 mg by mouth daily. Swallow whole.     losartan (COZAAR) 25 MG tablet TAKE 1 TABLET (25 MG TOTAL) BY MOUTH DAILY. 90 tablet 1   metoprolol succinate (TOPROL-XL) 25 MG 24 hr tablet Take 1 tablet (25 mg total) by mouth daily. 90 tablet 3   rosuvastatin (CRESTOR) 10 MG tablet Take 1 tablet (10 mg total) by mouth daily. 90 tablet 3   No facility-administered medications prior to visit.    PAST MEDICAL HISTORY: Past Medical History:  Diagnosis Date   Elevated cholesterol    Hypertension    Osteoporosis  PAST SURGICAL HISTORY: Past Surgical History:  Procedure Laterality Date   CHOLECYSTECTOMY      FAMILY HISTORY: Family History  Problem Relation Age of Onset   Osteoporosis Mother    Atrial fibrillation Mother    Stroke Father        x3   Dementia Father    Diabetes Father    Diabetes Sister    Stroke Brother    Diabetes Paternal Grandmother    Stroke Cousin 19    SOCIAL HISTORY: Social History   Socioeconomic History    Marital status: Married    Spouse name: Not on file   Number of children: Not on file   Years of education: Not on file   Highest education level: 12th grade  Occupational History   Not on file  Tobacco Use   Smoking status: Never   Smokeless tobacco: Never  Vaping Use   Vaping status: Never Used  Substance and Sexual Activity   Alcohol use: Not Currently   Drug use: Never   Sexual activity: Yes    Partners: Male  Other Topics Concern   Not on file  Social History Narrative   Not on file   Social Drivers of Health   Financial Resource Strain: Low Risk  (10/15/2023)   Overall Financial Resource Strain (CARDIA)    Difficulty of Paying Living Expenses: Not hard at all  Food Insecurity: No Food Insecurity (10/15/2023)   Hunger Vital Sign    Worried About Running Out of Food in the Last Year: Never true    Ran Out of Food in the Last Year: Never true  Transportation Needs: No Transportation Needs (10/15/2023)   PRAPARE - Administrator, Civil Service (Medical): No    Lack of Transportation (Non-Medical): No  Physical Activity: Insufficiently Active (10/15/2023)   Exercise Vital Sign    Days of Exercise per Week: 2 days    Minutes of Exercise per Session: 30 min  Stress: No Stress Concern Present (10/15/2023)   [employer] of Occupational Health - Occupational Stress Questionnaire    Feeling of Stress : Not at all  Social Connections: Moderately Isolated (10/15/2023)   Social Connection and Isolation Panel [NHANES]    Frequency of Communication with Friends and Family: Three times a week    Frequency of Social Gatherings with Friends and Family: Twice a week    Attends Religious Services: Never    Database administrator or Organizations: No    Attends Banker Meetings: Never    Marital Status: Married  Catering manager Violence: Not At Risk (10/15/2023)   Humiliation, Afraid, Rape, and Kick questionnaire    Fear of Current or Ex-Partner:  No    Emotionally Abused: No    Physically Abused: No    Sexually Abused: No    PHYSICAL EXAM  GENERAL EXAM/CONSTITUTIONAL: Vitals:  There were no vitals filed for this visit.  There is no height or weight on file to calculate BMI. Wt Readings from Last 3 Encounters:  01/19/24 132 lb (59.9 kg)  10/15/23 128 lb (58.1 kg)  10/13/23 128 lb 12.8 oz (58.4 kg)   Patient is in no distress; well developed, nourished and groomed; neck is supple  MUSCULOSKELETAL: Gait, strength, tone, movements noted in Neurologic exam below  NEUROLOGIC: MENTAL STATUS:     01/19/2024   10:38 AM  MMSE - Mini Mental State Exam  Orientation to time 1  Orientation to Place 4  Registration 3  Attention/  Calculation 5  Recall 2  Language- name 2 objects 2  Language- repeat 1  Language- follow 3 step command 3  Language- read & follow direction 1  Write a sentence 1  Copy design 1  Total score 24   awake, alert, oriented to person, place and time recent and remote memory intact normal attention and concentration language fluent, comprehension intact, naming intact fund of knowledge appropriate  CRANIAL NERVE:  2nd, 3rd, 4th, 6th- visual fields full to confrontation, extraocular muscles intact, no nystagmus 5th - facial sensation symmetric 7th - facial strength symmetric 8th - hearing intact 9th - palate elevates symmetrically, uvula midline 11th - shoulder shrug symmetric 12th - tongue protrusion midline   DIAGNOSTIC DATA (LABS, IMAGING, TESTING) - I reviewed patient records, labs, notes, testing and imaging myself where available.  Lab Results  Component Value Date   WBC 7.6 08/31/2022   HGB 13.2 08/31/2022   HCT 39.2 08/31/2022   MCV 93.5 08/31/2022   PLT 158.0 08/31/2022      Component Value Date/Time   NA 140 10/13/2023 0932   K 4.5 10/13/2023 0932   CL 105 10/13/2023 0932   CO2 30 10/13/2023 0932   GLUCOSE 128 (H) 10/13/2023 0932   BUN 22 10/13/2023 0932   CREATININE  0.97 10/13/2023 0932   CALCIUM 9.4 10/13/2023 0932   PROT 6.4 10/13/2023 0932   ALBUMIN 4.3 10/13/2023 0932   AST 19 10/13/2023 0932   ALT 18 10/13/2023 0932   ALKPHOS 54 10/13/2023 0932   BILITOT 0.7 10/13/2023 0932   GFRNONAA 53 (L) 02/06/2022 1900   Lab Results  Component Value Date   CHOL 177 10/13/2023   HDL 84.80 10/13/2023   LDLCALC 81 10/13/2023   TRIG 57.0 10/13/2023   CHOLHDL 2 10/13/2023   Lab Results  Component Value Date   HGBA1C 6.3 10/13/2023   Lab Results  Component Value Date   VITAMINB12 609 10/13/2023   Lab Results  Component Value Date   TSH 1.32 11/12/2022   ATN Profile consistent with Alzheimer disease biomarker    ASSESSMENT AND PLAN  73 y.o. year old female with hypertension, hyperlipidemia, stroke 6 years ago, mild cognitive impairment who is presenting for for follow-up.  At last visit we obtained ATN profile which was consistent with Alzheimer disease pathology.  She is interested in the new monoclonal antibodies, Leqembi versus Kisunla.  Plan will be to obtain a PET amyloid to confirm presence of amyloid plaque.  I will contact her to go over the result.  Advised her to return sooner if worse.   1. Mild cognitive impairment due to Alzheimer's disease [facility])      Patient Instructions  Continue current medications Will obtain a PET amyloid to confirm the presence of amyloid plaque I will contact you to go over the results and discuss further therapy option, Kisunla versus Leqembi.  Orders Placed This Encounter  Procedures   NM PET Brain Amyloid    No orders of the defined types were placed in this encounter.   No follow-ups on file.  Virtual Visit via Video Note  I connected with  [facility] on 01/28/24 at  9:45 AM EDT by a video enabled telemedicine application and verified that I am speaking with the correct person using two identifiers.  Location: Patient: home Provider: [facility] Office   I discussed the limitations of  evaluation and management by telemedicine and the availability of in person appointments. The patient expressed understanding and agreed to  proceed.   I discussed the assessment and treatment plan with the patient. The patient was provided an opportunity to ask questions and all were answered. The patient agreed with the plan and demonstrated an understanding of the instructions.   The patient was advised to call back or seek an in-person evaluation if the symptoms worsen or if the condition fails to improve as anticipated.  I provided 15 minutes of non-face-to-face time during this encounter.  I have spent a total of 30 minutes dedicated to this patient today, preparing to see patient, performing a medically appropriate examination and evaluation, ordering tests and/or medications and procedures, and counseling and educating the patient/family/caregiver; independently interpreting result and communicating results to the family/patient/caregiver; and documenting clinical information in the electronic medical record.   Windell Norfolk, MD 01/28/2024, 10:16 AM  [facility] [address], [address] [place], [place] [address] [phone]

## 2024-01-31 ENCOUNTER — Telehealth: Payer: Self-pay | Admitting: Neurology

## 2024-01-31 NOTE — Telephone Encounter (Signed)
 Ethlyn Gallery: [id number] exp. 01/31/24-03/31/24 sent to [facility] [phone]

## 2024-02-02 ENCOUNTER — Ambulatory Visit
Admission: RE | Admit: 2024-02-02 | Discharge: 2024-02-02 | Disposition: A | Discharge status: Home / Self Care | Source: Ambulatory Visit | Attending: Neurology | Admitting: Neurology

## 2024-02-02 DIAGNOSIS — G3184 Mild cognitive impairment, so stated: Secondary | ICD-10-CM

## 2024-02-06 ENCOUNTER — Encounter: Payer: Self-pay | Admitting: Neurology

## 2024-02-23 NOTE — Telephone Encounter (Signed)
 Danielle George called her again to schedule the pet scan and said: ok [place] and will call back when they return to schedule. He was asking for appt tomorrow but we can't get the dose that fast. Sorry.

## 2024-02-23 NOTE — Telephone Encounter (Signed)
 The people at [facility] who schedule the pet scan called her to schedule and her husband said she already had it done. The only thing I can see she had done was the MRI so I think maybe they are confused. Can you call them and explain what the difference is and why she still needs to get this done? The phone number to call to schedule is [phone]

## 2024-03-07 DIAGNOSIS — L918 Other hypertrophic disorders of the skin: Secondary | ICD-10-CM | POA: Diagnosis not present

## 2024-03-07 DIAGNOSIS — G3184 Mild cognitive impairment, so stated: Secondary | ICD-10-CM | POA: Diagnosis not present

## 2024-03-07 DIAGNOSIS — R413 Other amnesia: Secondary | ICD-10-CM | POA: Diagnosis not present

## 2024-03-07 DIAGNOSIS — S6992XA Unspecified injury of left wrist, hand and finger(s), initial encounter: Secondary | ICD-10-CM | POA: Diagnosis not present

## 2024-03-07 DIAGNOSIS — R109 Unspecified abdominal pain: Secondary | ICD-10-CM | POA: Diagnosis not present

## 2024-03-07 DIAGNOSIS — R151 Fecal smearing: Secondary | ICD-10-CM | POA: Diagnosis not present

## 2024-03-07 DIAGNOSIS — K449 Diaphragmatic hernia without obstruction or gangrene: Secondary | ICD-10-CM | POA: Diagnosis not present

## 2024-03-08 DIAGNOSIS — S6992XA Unspecified injury of left wrist, hand and finger(s), initial encounter: Secondary | ICD-10-CM | POA: Diagnosis not present

## 2024-03-13 DIAGNOSIS — R41842 Visuospatial deficit: Secondary | ICD-10-CM | POA: Diagnosis not present

## 2024-03-13 DIAGNOSIS — H53453 Other localized visual field defect, bilateral: Secondary | ICD-10-CM | POA: Diagnosis not present

## 2024-03-13 DIAGNOSIS — H53143 Visual discomfort, bilateral: Secondary | ICD-10-CM | POA: Diagnosis not present

## 2024-03-13 DIAGNOSIS — H5052 Exophoria: Secondary | ICD-10-CM | POA: Diagnosis not present

## 2024-03-17 DIAGNOSIS — L918 Other hypertrophic disorders of the skin: Secondary | ICD-10-CM | POA: Diagnosis not present

## 2024-04-25 ENCOUNTER — Other Ambulatory Visit (HOSPITAL_COMMUNITY): Payer: Self-pay

## 2024-04-26 ENCOUNTER — Other Ambulatory Visit (HOSPITAL_COMMUNITY): Payer: Self-pay

## 2024-05-13 ENCOUNTER — Other Ambulatory Visit: Payer: Self-pay | Admitting: Family

## 2024-05-13 DIAGNOSIS — R809 Proteinuria, unspecified: Secondary | ICD-10-CM

## 2024-05-15 MED ORDER — LOSARTAN POTASSIUM 25 MG PO TABS: 25.0000 mg | ORAL_TABLET | Freq: Every day | ORAL | 0 refills | Status: DC

## 2024-05-15 NOTE — Telephone Encounter (Signed)
 Pt is overdue for diabetes f/u. Please call and schedule.

## 2024-05-15 NOTE — Addendum Note (Signed)
 Addended by: NARCISO ANDREZ BROCKS on: 05/15/2024 02:01 PM   Modules accepted: Orders

## 2024-05-15 NOTE — Telephone Encounter (Signed)
Unable to leave vm, sent mychart message.

## 2024-06-06 ENCOUNTER — Other Ambulatory Visit: Payer: Self-pay | Admitting: Family

## 2024-06-06 DIAGNOSIS — R809 Proteinuria, unspecified: Secondary | ICD-10-CM

## 2024-07-10 DIAGNOSIS — R41842 Visuospatial deficit: Secondary | ICD-10-CM | POA: Diagnosis not present

## 2024-07-10 DIAGNOSIS — H5052 Exophoria: Secondary | ICD-10-CM | POA: Diagnosis not present

## 2024-07-10 DIAGNOSIS — H53143 Visual discomfort, bilateral: Secondary | ICD-10-CM | POA: Diagnosis not present

## 2024-07-25 NOTE — Progress Notes (Signed)
 Danielle George                                          MRN: [id number]   07/25/2024   The [facility] Quality Team Specialist reviewed this patient medical record for the purposes of chart review for care gap closure. The following were reviewed: chart review for care gap closure-glycemic status assessment and kidney health evaluation for diabetes:eGFR  and uACR.    [facility] Quality Team

## 2024-07-31 ENCOUNTER — Encounter: Payer: Self-pay | Admitting: Family

## 2024-08-01 ENCOUNTER — Other Ambulatory Visit: Payer: Self-pay | Admitting: Family

## 2024-08-01 DIAGNOSIS — K219 Gastro-esophageal reflux disease without esophagitis: Secondary | ICD-10-CM

## 2024-08-29 ENCOUNTER — Ambulatory Visit
Admission: RE | Admit: 2024-08-29 | Discharge: 2024-08-29 | Disposition: A | Source: Ambulatory Visit | Attending: Family

## 2024-08-29 ENCOUNTER — Ambulatory Visit
Admission: RE | Admit: 2024-08-29 | Discharge: 2024-08-29 | Disposition: A | Discharge status: Home / Self Care | Source: Ambulatory Visit | Attending: Family | Admitting: Family

## 2024-08-29 DIAGNOSIS — M810 Age-related osteoporosis without current pathological fracture: Secondary | ICD-10-CM | POA: Diagnosis not present

## 2024-08-29 DIAGNOSIS — Z1231 Encounter for screening mammogram for malignant neoplasm of breast: Secondary | ICD-10-CM

## 2024-08-29 DIAGNOSIS — M816 Localized osteoporosis [Lequesne]: Secondary | ICD-10-CM | POA: Insufficient documentation

## 2024-08-30 ENCOUNTER — Inpatient Hospital Stay
Admission: RE | Admit: 2024-08-30 | Discharge: 2024-08-30 | Disposition: A | Discharge status: Home / Self Care | Payer: Self-pay | Source: Ambulatory Visit | Attending: Family | Admitting: Family

## 2024-08-30 ENCOUNTER — Ambulatory Visit: Payer: Self-pay | Admitting: Family

## 2024-08-30 ENCOUNTER — Other Ambulatory Visit: Payer: Self-pay | Admitting: *Deleted

## 2024-08-30 ENCOUNTER — Inpatient Hospital Stay
Admission: RE | Admit: 2024-08-30 | Discharge: 2024-08-30 | Disposition: A | Payer: Self-pay | Source: Ambulatory Visit | Attending: Family

## 2024-08-30 DIAGNOSIS — Z1231 Encounter for screening mammogram for malignant neoplasm of breast: Secondary | ICD-10-CM

## 2024-09-07 ENCOUNTER — Other Ambulatory Visit: Payer: Self-pay | Admitting: Family

## 2024-09-07 DIAGNOSIS — R809 Proteinuria, unspecified: Secondary | ICD-10-CM

## 2024-09-11 NOTE — Telephone Encounter (Signed)
 She got scheduled for the pet scan on 11/17 and I was able to update the approval dates on her prior PA: [employer]here auth: [id number] exp. 03/31/24-11/01/24

## 2024-09-12 ENCOUNTER — Encounter: Payer: Self-pay | Admitting: Family

## 2024-09-12 ENCOUNTER — Ambulatory Visit: Admitting: Family

## 2024-09-12 VITALS — BP 112/66 | HR 78 | Temp 97.7°F | Ht 64.0 in | Wt 136.6 lb

## 2024-09-12 DIAGNOSIS — L989 Disorder of the skin and subcutaneous tissue, unspecified: Secondary | ICD-10-CM

## 2024-09-12 NOTE — Progress Notes (Signed)
 Established Patient Office Visit  Subjective:      CC:  Chief Complaint  Patient presents with   Acute Visit    Reports having bumps on her face right above her right eyebrow. These have been present for 1 month.    HPI: Danielle George is a 73 y.o. female presenting on 09/12/2024 for Acute Visit (Reports having bumps on her face right above her right eyebrow. These have been present for 1 month.) .  Discussed the use of AI scribe software for clinical note transcription with the patient, who gave verbal consent to proceed.  History of Present Illness Danielle George is a 73 year old female who presents with skin bumps on her face.  She noticed a couple of bumps on her face a few months ago. These bumps resemble acne spots but are not itchy. She has attempted to manipulate them, resulting in soreness, but they are not expressible. The bumps have not changed in size and are not painful unless manipulated. Similar bumps were previously treated by a dermatologist last spring.  She has a history of excessive sweating, which she attributes to having longer hair and spending time outside.  No itching or change in size of the facial bumps. They are not painful unless manipulated.         Social history:  Relevant past medical, surgical, family and social history reviewed and updated as indicated. Interim medical history since our last visit reviewed.  Allergies and medications reviewed and updated.  DATA REVIEWED: CHART IN EPIC     ROS: Negative unless specifically indicated above in HPI.    Current Outpatient Medications:    alendronate  (FOSAMAX ) 70 MG tablet, Take 1 tablet (70 mg total) by mouth every 7 (seven) days. Take with a full glass of water on an empty stomach., Disp: 4 tablet, Rfl: 11   aspirin EC 81 MG tablet, Take 81 mg by mouth daily. Swallow whole., Disp: , Rfl:    losartan  (COZAAR ) 25 MG tablet, TAKE 1 TABLET (25 MG TOTAL) BY MOUTH  DAILY., Disp: 90 tablet, Rfl: 0   metoprolol  succinate (TOPROL -XL) 25 MG 24 hr tablet, Take 1 tablet (25 mg total) by mouth daily., Disp: 90 tablet, Rfl: 3   rosuvastatin  (CRESTOR ) 10 MG tablet, Take 1 tablet (10 mg total) by mouth daily., Disp: 90 tablet, Rfl: 3        Objective:        BP 112/66 (BP Location: Right Arm, Patient Position: Sitting, Cuff Size: Normal)   Pulse 78   Temp 97.7 F (36.5 C) (Temporal)   Ht 5' 4 (1.626 m)   Wt 136 lb 9.6 oz (62 kg)   SpO2 96%   BMI 23.45 kg/m   Physical Exam SKIN: Presence of bumps on skin, possibly cystic, with a scaly texture, consistent with epidermoid cysts. Not itchy or painful unless pressure is applied.  Wt Readings from Last 3 Encounters:  09/12/24 136 lb 9.6 oz (62 kg)  01/19/24 132 lb (59.9 kg)  10/15/23 128 lb (58.1 kg)    Physical Exam Skin:    Comments: flesh-colored cystic nodule left upper forehead nontender  Half annular lesion raised hard to touch non mobile with grey hypopigmentation left upper forehead   Right lateral elbow subcutaneous raised scaly textured lesion with skin colored pigmentation with mild surrounding reddening            Results RADIOLOGY Mammogram: Normal (08/2024) Bone density scan: Normal (08/2024)  Assessment &  Plan:   Assessment and Plan Assessment & Plan Epidermoid cyst of skin Epidermoid cyst present for a few months, non-itchy, non-painful unless manipulated. Likely cystic with sebum content. Differential includes basal squamous cell carcinoma, but less likely. Dermatologist evaluation recommended for further assessment and potential treatment. - Referred to dermatologist for evaluation and potential treatment of epidermoid cyst.  General Health Maintenance Routine health maintenance discussed. Upcoming appointments for physical and annual wellness visit scheduled. Recent mammogram and bone density scan results were normal. - Scheduled physical and annual wellness  visit for December 11th. - Ensured labs are performed during the physical appointment.        Return for as scheduled for CPE.     Ginger Patrick, MSN, APRN, FNP-C [facility]

## 2024-09-16 ENCOUNTER — Other Ambulatory Visit: Payer: Self-pay | Admitting: Family

## 2024-09-16 DIAGNOSIS — Z833 Family history of diabetes mellitus: Secondary | ICD-10-CM | POA: Diagnosis not present

## 2024-09-16 DIAGNOSIS — E785 Hyperlipidemia, unspecified: Secondary | ICD-10-CM | POA: Diagnosis not present

## 2024-09-16 DIAGNOSIS — Z7982 Long term (current) use of aspirin: Secondary | ICD-10-CM | POA: Diagnosis not present

## 2024-09-16 DIAGNOSIS — M810 Age-related osteoporosis without current pathological fracture: Secondary | ICD-10-CM | POA: Diagnosis not present

## 2024-09-16 DIAGNOSIS — G3184 Mild cognitive impairment, so stated: Secondary | ICD-10-CM | POA: Diagnosis not present

## 2024-09-16 DIAGNOSIS — M816 Localized osteoporosis [Lequesne]: Secondary | ICD-10-CM

## 2024-09-16 DIAGNOSIS — M1990 Unspecified osteoarthritis, unspecified site: Secondary | ICD-10-CM | POA: Diagnosis not present

## 2024-09-16 DIAGNOSIS — Z8249 Family history of ischemic heart disease and other diseases of the circulatory system: Secondary | ICD-10-CM | POA: Diagnosis not present

## 2024-09-16 DIAGNOSIS — K219 Gastro-esophageal reflux disease without esophagitis: Secondary | ICD-10-CM | POA: Diagnosis not present

## 2024-09-16 DIAGNOSIS — I10 Essential (primary) hypertension: Secondary | ICD-10-CM | POA: Diagnosis not present

## 2024-09-18 ENCOUNTER — Encounter (HOSPITAL_COMMUNITY)
Admission: RE | Admit: 2024-09-18 | Discharge: 2024-09-18 | Disposition: A | Discharge status: Home / Self Care | Source: Ambulatory Visit | Attending: Neurology | Admitting: Neurology

## 2024-09-18 DIAGNOSIS — G3184 Mild cognitive impairment, so stated: Secondary | ICD-10-CM | POA: Diagnosis not present

## 2024-09-18 DIAGNOSIS — G309 Alzheimer's disease, unspecified: Secondary | ICD-10-CM | POA: Diagnosis not present

## 2024-09-18 MED ORDER — FLORBETAPIR F 18 500-1900 MBQ/ML IV SOLN
10.0000 | Freq: Once | INTRAVENOUS | Status: AC
  Administered 2024-09-18: 10.32 via INTRAVENOUS

## 2024-09-19 DIAGNOSIS — L570 Actinic keratosis: Secondary | ICD-10-CM | POA: Diagnosis not present

## 2024-09-19 DIAGNOSIS — L720 Epidermal cyst: Secondary | ICD-10-CM | POA: Diagnosis not present

## 2024-09-19 DIAGNOSIS — D485 Neoplasm of uncertain behavior of skin: Secondary | ICD-10-CM | POA: Diagnosis not present

## 2024-09-19 DIAGNOSIS — D045 Carcinoma in situ of skin of trunk: Secondary | ICD-10-CM | POA: Diagnosis not present

## 2024-09-22 ENCOUNTER — Ambulatory Visit: Payer: Self-pay | Admitting: Neurology

## 2024-10-02 ENCOUNTER — Ambulatory Visit: Payer: Self-pay | Admitting: Family

## 2024-10-02 NOTE — Progress Notes (Signed)
 noted

## 2024-10-17 ENCOUNTER — Ambulatory Visit

## 2024-10-17 ENCOUNTER — Ambulatory Visit: Admitting: Family

## 2024-10-17 ENCOUNTER — Encounter: Payer: Self-pay | Admitting: Family

## 2024-10-17 VITALS — BP 124/84 | HR 98 | Temp 98.0°F | Ht 64.0 in | Wt 140.6 lb

## 2024-10-17 DIAGNOSIS — E785 Hyperlipidemia, unspecified: Secondary | ICD-10-CM

## 2024-10-17 DIAGNOSIS — M816 Localized osteoporosis [Lequesne]: Secondary | ICD-10-CM

## 2024-10-17 DIAGNOSIS — R1011 Right upper quadrant pain: Secondary | ICD-10-CM

## 2024-10-17 DIAGNOSIS — Z0001 Encounter for general adult medical examination with abnormal findings: Secondary | ICD-10-CM

## 2024-10-17 DIAGNOSIS — E119 Type 2 diabetes mellitus without complications: Secondary | ICD-10-CM | POA: Diagnosis not present

## 2024-10-17 DIAGNOSIS — K449 Diaphragmatic hernia without obstruction or gangrene: Secondary | ICD-10-CM

## 2024-10-17 DIAGNOSIS — I10 Essential (primary) hypertension: Secondary | ICD-10-CM | POA: Diagnosis not present

## 2024-10-17 DIAGNOSIS — R1013 Epigastric pain: Secondary | ICD-10-CM

## 2024-10-17 DIAGNOSIS — K219 Gastro-esophageal reflux disease without esophagitis: Secondary | ICD-10-CM

## 2024-10-17 LAB — LIPID PANEL
Cholesterol: 166 mg/dL (ref 28–200)
HDL: 76.4 mg/dL (ref >39.00)
LDL Cholesterol: 65 mg/dL (ref 10–99)
NonHDL: 89.28
Total CHOL/HDL Ratio: 2
Triglycerides: 123 mg/dL (ref 10.0–149.0)
VLDL: 24.6 mg/dL (ref 0.0–40.0)

## 2024-10-17 LAB — CBC WITH DIFFERENTIAL/PLATELET
Basophils Absolute: 0 K/uL (ref 0.0–0.1)
Basophils Relative: 0.4 % (ref 0.0–3.0)
Eosinophils Absolute: 0 K/uL (ref 0.0–0.7)
Eosinophils Relative: 0.6 % (ref 0.0–5.0)
HCT: 37.6 % (ref 36.0–46.0)
Hemoglobin: 12.9 g/dL (ref 12.0–15.0)
Lymphocytes Relative: 16.5 % (ref 12.0–46.0)
Lymphs Abs: 1.2 K/uL (ref 0.7–4.0)
MCHC: 34.3 g/dL (ref 30.0–36.0)
MCV: 95 fl (ref 78.0–100.0)
Monocytes Absolute: 0.4 K/uL (ref 0.1–1.0)
Monocytes Relative: 6 % (ref 3.0–12.0)
Neutro Abs: 5.7 K/uL (ref 1.4–7.7)
Neutrophils Relative %: 76.5 % (ref 43.0–77.0)
Platelets: 168 K/uL (ref 150.0–400.0)
RBC: 3.96 Mil/uL (ref 3.87–5.11)
RDW: 12.8 % (ref 11.5–15.5)
WBC: 7.4 K/uL (ref 4.0–10.5)

## 2024-10-17 LAB — COMPREHENSIVE METABOLIC PANEL WITH GFR
ALT: 18 U/L (ref 3–35)
AST: 19 U/L (ref 5–37)
Albumin: 4.3 g/dL (ref 3.5–5.2)
Alkaline Phosphatase: 49 U/L (ref 39–117)
BUN: 22 mg/dL (ref 6–23)
CO2: 30 meq/L (ref 19–32)
Calcium: 9.6 mg/dL (ref 8.4–10.5)
Chloride: 103 meq/L (ref 96–112)
Creatinine, Ser: 1.16 mg/dL (ref 0.40–1.20)
GFR: 47.55 mL/min — ABNORMAL LOW (ref >60.00)
Glucose, Bld: 160 mg/dL — ABNORMAL HIGH (ref 70–99)
Potassium: 4.1 meq/L (ref 3.5–5.1)
Sodium: 140 meq/L (ref 135–145)
Total Bilirubin: 0.6 mg/dL (ref 0.2–1.2)
Total Protein: 6.8 g/dL (ref 6.0–8.3)

## 2024-10-17 LAB — MICROALBUMIN / CREATININE URINE RATIO
Creatinine,U: 221.6 mg/dL
Microalb Creat Ratio: 23 mg/g (ref 0.0–30.0)
Microalb, Ur: 5.1 mg/dL — ABNORMAL HIGH (ref 0.7–1.9)

## 2024-10-17 LAB — HEMOGLOBIN A1C: Hgb A1c MFr Bld: 6.8 % — ABNORMAL HIGH (ref 4.6–6.5)

## 2024-10-17 MED ORDER — ROSUVASTATIN CALCIUM 10 MG PO TABS: 10.0000 mg | ORAL_TABLET | Freq: Every day | ORAL | 3 refills | Status: AC

## 2024-10-17 MED ORDER — PANTOPRAZOLE SODIUM 40 MG PO TBEC: 40.0000 mg | DELAYED_RELEASE_TABLET | Freq: Every day | ORAL | 3 refills | Status: DC

## 2024-10-17 MED ORDER — METOPROLOL SUCCINATE ER 25 MG PO TB24: 25.0000 mg | ORAL_TABLET | Freq: Every day | ORAL | 3 refills | Status: AC

## 2024-10-17 NOTE — Progress Notes (Signed)
 Subjective:  Patient ID: Danielle George, female    DOB: 1951-03-22  Age: 73 y.o. MRN: [id number]  Patient Care Team: Corwin Antu, FNP as PCP - General (Family Medicine)   CC:  Chief Complaint  Patient presents with   Follow-up    Review mammogram and bone density results.    HPI Danielle George is a 73 y.o. female who presents today for an annual physical exam. She reports consuming a general diet. Active in her yard  She generally feels well. She reports sleeping well. She does have additional problems to discuss today.   Vision:Within last year Dental:Receives regular dental care  Mammogram: 08/29/24  Last pap: > 65 n/a Colonoscopy: Bone density scan: 08/29/24 osteoporosis lumbar spine bil hips osteopenia   Pt is with acute concerns.   Discussed the use of AI scribe software for clinical note transcription with the patient, who gave verbal consent to proceed.  History of Present Illness Danielle George is a 73 year old female with a history of hiatal hernia who presents with upper abdominal discomfort.  She has been experiencing upper abdominal discomfort for the past two to three weeks, describing it as a soreness similar to being 'punched' in the stomach. The discomfort is intermittent but present at the time of the visit. She reports soreness after raking leaves. No heartburn, right-sided abdominal pain, or changes in bowel movements, although she occasionally experiences diarrhea.  She has a history of hiatal hernia and has been taking Prilosec (omeprazole ) 20 mg twice daily for over a year. Initially, the medication alleviated symptoms like gurgling and gas, but it is no longer as effective. She has not experienced heartburn since a significant episode five to six years ago that required emergency medical attention. She has not seen a gastroenterologist since moving from [place] , where she last had a colonoscopy approximately two years ago.  She has  switched to almond milk recently, suspecting dairy might contribute to her gastrointestinal issues.  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 She has been taking Fosamax  for about two years without any reported exacerbation of her gastrointestinal symptoms.  She recently moved from [place]  and sold her house there. She engages in physical activities such as raking leaves. No nausea or vomiting. Sleeps well at night.   Advanced Directives Patient does have advanced directives. She  have a copy in the electronic medical record.   DEPRESSION SCREENING    10/17/2024   11:17 AM 10/15/2023   11:52 AM 10/13/2023    8:32 AM 11/12/2022   12:01 PM 10/20/2022   10:53 AM 08/31/2022    8:37 AM  PHQ 2/9 Scores  PHQ - 2 Score 0 0 0 0 0 0  PHQ- 9 Score 0  0         Data saved with a previous flowsheet row definition     ROS: Negative unless specifically indicated above in HPI.   Current Medications[1]    Objective:    BP 124/84 (BP Location: Left Arm, Patient Position: Sitting, Cuff Size: Normal)   Pulse 98   Temp 98 F (36.7 C) (Temporal)   Ht 5' 4 (1.626 m)   Wt 140 lb 9.6 oz (63.8 kg)   SpO2 97%   BMI 24.13 kg/m   BP Readings from Last 3 Encounters:  10/17/24 124/84  09/12/24 112/66  01/19/24 128/88      Physical Exam Constitutional:  General: She is not in acute distress.    Appearance: Normal appearance. She is normal weight. She is not ill-appearing.  HENT:     Head: Normocephalic.     Right Ear: Tympanic membrane normal.     Left Ear: Tympanic membrane normal.     Nose: Nose normal.     Mouth/Throat:     Mouth: Mucous membranes are moist.  Eyes:     Extraocular Movements: Extraocular movements intact.     Pupils: Pupils are equal, round, and reactive to light.  Cardiovascular:     Rate and Rhythm: Normal rate and regular rhythm.  Pulmonary:     Effort: Pulmonary effort  is normal.     Breath sounds: Normal breath sounds.  Abdominal:     General: Abdomen is flat. Bowel sounds are normal.     Palpations: Abdomen is soft.     Tenderness: There is abdominal tenderness in the right upper quadrant, epigastric area and left upper quadrant. There is no guarding or rebound.  Musculoskeletal:        General: Normal range of motion.     Cervical back: Normal range of motion.  Skin:    General: Skin is warm.     Capillary Refill: Capillary refill takes less than 2 seconds.  Neurological:     General: No focal deficit present.     Mental Status: She is alert.  Psychiatric:        Mood and Affect: Mood normal.        Behavior: Behavior normal.        Thought Content: Thought content normal.        Judgment: Judgment normal.       Results RADIOLOGY Bone density: Osteoporosis in lumbar spine, osteopenia in left hip, osteopenia in right hip, T-score -2.7 (08/29/2024) Mammogram: Normal (08/2024)      Assessment & Plan:   Assessment and Plan Assessment & Plan Hiatal hernia with gastroesophageal reflux disease and epigastric pain Chronic hiatal hernia with associated GERD and epigastric pain. Symptoms include upper abdominal soreness, gurgling, and gas, exacerbated by physical activity. Prilosec (omeprazole ) 40 mg twice daily has reduced gurgling but not the soreness. Differential includes esophagitis, Barrett's esophagus, esophageal varices, and H. pylori infection. Long-term use of PPI may reduce efficacy. - Switched from omeprazole  to pantoprazole . - Referred to gastroenterologist for further evaluation. - Ordered H. pylori breath test. - Advised trial of dairy elimination for 2-4 weeks to assess for lactose intolerance. - Instructed to seek medical attention if symptoms worsen or if a bulge is felt in the abdomen.  Essential hypertension Hypertension managed with losartan , which also provides renal protection. - Continue losartan  for blood pressure  and renal protection. - Ordered annual urine microalbumin test to monitor kidney function.  Type 2 diabetes mellitus Diabetes is well-controlled with no diabetic retinopathy noted on recent eye exam.  Localized osteoporosis Osteoporosis in the lower back with osteopenia in the hips. Managed with Fosamax  for approximately two years. - Continue Fosamax  with proper administration technique. - Plan to reassess bone density after five years of Fosamax  use.  General health maintenance Routine health maintenance is up to date. Recent mammogram and bone density scan were normal. Dental and eye exams are current. - Continue routine health maintenance and screenings.  Patient Counseling(The following topics were reviewed):  Preventative care handout given to pt  Health maintenance and immunizations reviewed. Please refer to Health maintenance section. Pt advised on safe sex, wearing seatbelts in car, and proper  nutrition labwork ordered today for annual Dental health: Discussed importance of regular tooth brushing, flossing, and dental visits.      Follow-up: Return in about 6 months (around 04/17/2025) for f/u diabetes.   Ginger Patrick, FNP     [1]  Current Outpatient Medications:    alendronate  (FOSAMAX ) 70 MG tablet, TAKE 1 TABLET (70 MG TOTAL) BY MOUTH EVERY 7 DAYS WITH FULL GLASS WATER ON EMPTY STOMACH, Disp: 12 tablet, Rfl: 3   aspirin EC 81 MG tablet, Take 81 mg by mouth daily. Swallow whole., Disp: , Rfl:    losartan  (COZAAR ) 25 MG tablet, TAKE 1 TABLET (25 MG TOTAL) BY MOUTH DAILY., Disp: 90 tablet, Rfl: 0   pantoprazole  (PROTONIX ) 40 MG tablet, Take 1 tablet (40 mg total) by mouth daily., Disp: 30 tablet, Rfl: 3   metoprolol  succinate (TOPROL -XL) 25 MG 24 hr tablet, Take 1 tablet (25 mg total) by mouth daily., Disp: 90 tablet, Rfl: 3   rosuvastatin  (CRESTOR ) 10 MG tablet, Take 1 tablet (10 mg total) by mouth daily., Disp: 90 tablet, Rfl: 3

## 2024-10-18 LAB — H. PYLORI BREATH TEST: H. pylori Breath Test: NOT DETECTED

## 2024-10-19 ENCOUNTER — Ambulatory Visit: Payer: Self-pay | Admitting: Family

## 2024-11-03 MED ORDER — OMEPRAZOLE 20 MG PO CPDR: 20.0000 mg | DELAYED_RELEASE_CAPSULE | Freq: Every day | ORAL | 3 refills | Status: DC

## 2024-11-06 ENCOUNTER — Ambulatory Visit: Payer: Self-pay | Admitting: Family

## 2024-11-06 ENCOUNTER — Ambulatory Visit
Admission: RE | Admit: 2024-11-06 | Discharge: 2024-11-06 | Disposition: A | Discharge status: Home / Self Care | Source: Ambulatory Visit | Attending: Family | Admitting: Family

## 2024-11-06 DIAGNOSIS — R1011 Right upper quadrant pain: Secondary | ICD-10-CM | POA: Diagnosis present

## 2024-11-10 MED ORDER — OMEPRAZOLE 20 MG PO CPDR: 20.0000 mg | DELAYED_RELEASE_CAPSULE | Freq: Two times a day (BID) | ORAL | 1 refills | Status: AC

## 2024-11-10 NOTE — Addendum Note (Signed)
 Addended by: CORWIN ANTU on: 11/10/2024 03:02 PM   Modules accepted: Orders

## 2024-12-04 ENCOUNTER — Ambulatory Visit

## 2024-12-05 ENCOUNTER — Ambulatory Visit

## 2024-12-05 ENCOUNTER — Encounter: Payer: Self-pay | Admitting: *Deleted

## 2024-12-05 ENCOUNTER — Other Ambulatory Visit: Payer: Self-pay

## 2024-12-05 VITALS — BP 133/83 | HR 84 | Temp 98.8°F | Ht 64.0 in | Wt 136.5 lb

## 2024-12-05 DIAGNOSIS — Z8379 Family history of other diseases of the digestive system: Secondary | ICD-10-CM

## 2024-12-05 DIAGNOSIS — R1013 Epigastric pain: Secondary | ICD-10-CM

## 2024-12-05 DIAGNOSIS — G8929 Other chronic pain: Secondary | ICD-10-CM | POA: Insufficient documentation

## 2024-12-18 ENCOUNTER — Ambulatory Visit

## 2024-12-21 ENCOUNTER — Ambulatory Visit: Admit: 2024-12-21

## 2025-01-18 ENCOUNTER — Ambulatory Visit: Admitting: Neurology
